# Patient Record
Sex: Female | Born: 1945
Health system: Southern US, Community
[De-identification: ages and names within clinical notes are randomized; demographics above are authoritative.]

## PROBLEM LIST (undated history)

## (undated) DIAGNOSIS — J45909 Unspecified asthma, uncomplicated: Secondary | ICD-10-CM

## (undated) DIAGNOSIS — H269 Unspecified cataract: Secondary | ICD-10-CM

## (undated) DIAGNOSIS — I10 Essential (primary) hypertension: Secondary | ICD-10-CM

## (undated) HISTORY — DX: Unspecified asthma, uncomplicated: J45.909

## (undated) HISTORY — DX: Essential (primary) hypertension: I10

## (undated) HISTORY — DX: Unspecified cataract: H26.9

---

## 2011-08-17 HISTORY — PX: CHOLECYSTECTOMY: SHX55

## 2013-03-13 ENCOUNTER — Encounter: Payer: Self-pay | Admitting: Family Medicine

## 2013-03-13 ENCOUNTER — Telehealth: Payer: Self-pay | Admitting: Family Medicine

## 2013-03-13 ENCOUNTER — Other Ambulatory Visit: Payer: Self-pay | Admitting: Family Medicine

## 2013-03-13 ENCOUNTER — Ambulatory Visit: Payer: Self-pay | Admitting: Family Medicine

## 2013-03-13 VITALS — BP 141/78 | HR 67 | Temp 98.2°F | Ht <= 58 in | Wt 104.0 lb

## 2013-03-13 DIAGNOSIS — H8110 Benign paroxysmal vertigo, unspecified ear: Secondary | ICD-10-CM | POA: Insufficient documentation

## 2013-03-13 MED ORDER — MECLIZINE HCL 25 MG PO TABS: 25.0000 mg | ORAL_TABLET | Freq: Three times a day (TID) | ORAL | Status: DC | PRN

## 2013-03-13 NOTE — Patient Instructions (Signed)
Vertigo Vertigo means you feel like you or your surroundings are moving when they are not. Vertigo can be dangerous if it occurs when you are at work, driving, or performing difficult activities.  CAUSES  Vertigo occurs when there is a conflict of signals sent to your brain from the visual and sensory systems in your body. There are many different causes of vertigo, including:  Infections, especially in the inner ear.  A bad reaction to a drug or misuse of alcohol and medicines.  Withdrawal from drugs or alcohol.  Rapidly changing positions, such as lying down or rolling over in bed.  A migraine headache.  Decreased blood flow to the brain.  Increased pressure in the brain from a head injury, infection, tumor, or bleeding. SYMPTOMS  You may feel as though the world is spinning around or you are falling to the ground. Because your balance is upset, vertigo can cause nausea and vomiting. You may have involuntary eye movements (nystagmus). DIAGNOSIS  Vertigo is usually diagnosed by physical exam. If the cause of your vertigo is unknown, your caregiver may perform imaging tests, such as an MRI scan (magnetic resonance imaging). TREATMENT  Most cases of vertigo resolve on their own, without treatment. Depending on the cause, your caregiver may prescribe certain medicines. If your vertigo is related to body position issues, your caregiver may recommend movements or procedures to correct the problem. In rare cases, if your vertigo is caused by certain inner ear problems, you may need surgery. HOME CARE INSTRUCTIONS   Follow your caregiver's instructions.  Avoid driving.  Avoid operating heavy machinery.  Avoid performing any tasks that would be dangerous to you or others during a vertigo episode.  Tell your caregiver if you notice that certain medicines seem to be causing your vertigo. Some of the medicines used to treat vertigo episodes can actually make them worse in some people. SEEK  IMMEDIATE MEDICAL CARE IF:   Your medicines do not relieve your vertigo or are making it worse.  You develop problems with talking, walking, weakness, or using your arms, hands, or legs.  You develop severe headaches.  Your nausea or vomiting continues or gets worse.  You develop visual changes.  A family member notices behavioral changes.  Your condition gets worse. MAKE SURE YOU:  Understand these instructions.  Will watch your condition.  Will get help right away if you are not doing well or get worse. Document Released: 05/12/2005 Document Revised: 10/25/2011 Document Reviewed: 02/18/2011 Exit[employer] Patient Information 2014 [employer].  

## 2013-03-13 NOTE — Progress Notes (Signed)
Patient ID: Rebecca Macdonald, female   DOB: [birth year], 67 y.o.   MRN: [id number] SUBJECTIVE: CC: Chief Complaint  Patient presents with  . Acute Visit    yesterday when getes up in am  "room spins but get ok thru out day gets better  . Medication Problem    wants to know if should continue vitamin d .   . Fever    states had fever for 2 days     HPI: Woke up and felt left ear feels like it was popping and then was dizzy with the room spinning. No headache, had a little temperature which went away.  ROS: As above in the HPI. All other systems are stable or negative.  OBJECTIVE: APPEARANCE:  Patient in no acute distress.The patient appeared well nourished and normally developed. Acyanotic. Waist: VITAL SIGNS:BP 141/78  Pulse 67  Temp(Src) 98.2 F (36.8 C) (Oral)  Ht 4\' 10"  (1.473 m)  Wt 104 lb (47.174 kg)  BMI 21.74 kg/m2   SKIN: warm and  Dry without overt rashes, tattoos and scars  HEAD and Neck: without JVD, Head and scalp: normal Eyes:No scleral icterus. Fundi normal, eye movements normal. Ears: Auricle normal, canal normal, Tympanic membranes normal, insufflation normal. Nose: normal Throat: many missing teeth Neck & thyroid: normal  CHEST & LUNGS: Chest wall: normal Lungs: Clear  CVS: Reveals the PMI to be normally located. Regular rhythm, First and Second Heart sounds are normal,  absence of murmurs, rubs or gallops. Peripheral vasculature: Radial pulses: normal Dorsal pedis pulses: normal Posterior pulses: normal  ABDOMEN:  Appearance: normal Benign, no organomegaly, no masses, no Abdominal Aortic enlargement. No Guarding , no rebound. No Bruits. Bowel sounds: normal  RECTAL: N/A GU: N/A  EXTREMETIES: nonedematous.  MUSCULOSKELETAL:  Spine: normal Joints: intact  NEUROLOGIC: oriented to time,place and person; nonfocal. Strength is normal Sensory is normal Reflexes are normal Cranial Nerves are normal. dix-halpike test mildly positive with  right lateral gaze.  ASSESSMENT: Benign paroxysmal positional vertigo - Plan: meclizine (ANTIVERT) 25 MG tablet  PLAN: Meds ordered this encounter  Medications  . omeprazole (PRILOSEC OTC) 20 MG tablet    Sig: Take 20 mg by mouth daily.  . meclizine (ANTIVERT) 25 MG tablet    Sig: Take 1 tablet (25 mg total) by mouth 3 (three) times daily as needed.    Dispense:  30 tablet    Refill:  0   Handout on Vertigo in the AVS..  Return in about 14 weeks (around 06/19/2013) for welcome to medicare physical..  Thelma Barge P. [place] Charon, M.D.

## 2013-03-13 NOTE — Telephone Encounter (Signed)
appt made

## 2013-06-19 ENCOUNTER — Ambulatory Visit: Payer: Self-pay | Admitting: Family Medicine

## 2013-06-21 ENCOUNTER — Encounter (INDEPENDENT_AMBULATORY_CARE_PROVIDER_SITE_OTHER): Payer: Self-pay

## 2013-06-21 ENCOUNTER — Ambulatory Visit: Payer: Medicare Other | Admitting: *Deleted

## 2013-06-21 ENCOUNTER — Other Ambulatory Visit (INDEPENDENT_AMBULATORY_CARE_PROVIDER_SITE_OTHER): Payer: Medicare Other | Admitting: *Deleted

## 2013-06-21 DIAGNOSIS — Z23 Encounter for immunization: Secondary | ICD-10-CM

## 2015-11-27 DIAGNOSIS — H5200 Hypermetropia, unspecified eye: Secondary | ICD-10-CM | POA: Diagnosis not present

## 2015-11-27 DIAGNOSIS — Z0100 Encounter for examination of eyes and vision without abnormal findings: Secondary | ICD-10-CM | POA: Diagnosis not present

## 2015-11-27 DIAGNOSIS — H25013 Cortical age-related cataract, bilateral: Secondary | ICD-10-CM | POA: Diagnosis not present

## 2016-04-16 ENCOUNTER — Ambulatory Visit (INDEPENDENT_AMBULATORY_CARE_PROVIDER_SITE_OTHER): Payer: Medicare HMO | Admitting: Pediatrics

## 2016-04-16 ENCOUNTER — Encounter: Payer: Self-pay | Admitting: Pediatrics

## 2016-04-16 VITALS — BP 136/74 | HR 62 | Temp 98.0°F | Ht <= 58 in | Wt 113.4 lb

## 2016-04-16 DIAGNOSIS — H8110 Benign paroxysmal vertigo, unspecified ear: Secondary | ICD-10-CM | POA: Diagnosis not present

## 2016-04-16 DIAGNOSIS — Z87891 Personal history of nicotine dependence: Secondary | ICD-10-CM

## 2016-04-16 DIAGNOSIS — R195 Other fecal abnormalities: Secondary | ICD-10-CM

## 2016-04-16 DIAGNOSIS — Z1239 Encounter for other screening for malignant neoplasm of breast: Secondary | ICD-10-CM | POA: Diagnosis not present

## 2016-04-16 DIAGNOSIS — Z1159 Encounter for screening for other viral diseases: Secondary | ICD-10-CM

## 2016-04-16 DIAGNOSIS — Z720 Tobacco use: Secondary | ICD-10-CM | POA: Diagnosis not present

## 2016-04-16 DIAGNOSIS — Z1211 Encounter for screening for malignant neoplasm of colon: Secondary | ICD-10-CM

## 2016-04-16 DIAGNOSIS — Z23 Encounter for immunization: Secondary | ICD-10-CM

## 2016-04-16 NOTE — Progress Notes (Signed)
  Subjective:   Patient ID: Rebecca Macdonald, female    DOB: [birth year], 70 y.o.   MRN: [id number] CC: New Patient (Initial Visit) follow up multiple med problems  HPI: Rebecca Macdonald is a 70 y.o. female presenting for New Patient (Initial Visit)  Overall feeling well Uses bathroom a lot after taking zantac Stopped all of her stomach medicines a week ago, feeling better, more normal stooling, but sometimes goes a day without stool H/o hemorrhoids several years ago, better now  Gallbladder out in 2015, since then having pain and loose stools with greasy meals Feels fine otherwise  No weight loss Eating regular meals Avoiding foods that make symptoms worse  Never had colonoscopy Pap smear: has been a long time since had pap  No chest pain Headaches and swimmy headed in the morning sometimes Has h/o BPPV, meclizine helps when she has a flare Hurts in forehead, takes tylenol, happens every once in a while, not weekly  Past Medical History:  Diagnosis Date  . Asthma   . Cataract   . Hypertension    Fam hx: No colon ca, no breast ca  History  Smoking Status  . Former Smoker  . Quit date: 03/13/2009  Smokeless Tobacco  . Never Used   ROS: All systems negative other than what is in the HPI  Objective:    BP 136/74   Pulse 62   Temp 98 F (36.7 C) (Oral)   Ht _0  (1.473 m)   Wt 113 lb 6.4 oz (51.4 kg)   BMI 23.70 kg/m   Wt Readings from Last 3 Encounters:  04/16/16 113 lb 6.4 oz (51.4 kg)  03/13/13 104 lb (47.2 kg)    Gen: NAD, alert, cooperative with exam, NCAT EYES: EOMI, no conjunctival injection, or no icterus ENT:  TMs pearly gray b/l, OP without erythema LYMPH: no cervical LAD CV: NRRR, normal S1/S2, no murmur, distal pulses 2+ b/l Resp: CTABL, no wheezes, normal WOB Abd: +BS, soft, NT, mildly distended. no guarding or organomegaly Ext: No edema, warm Neuro: Alert and oriented, strength equal b/l UE and LE, coordination grossly normal MSK: normal muscle  bulk  Assessment & Plan:  Sua was seen today for new patient (initial visit), follow up multiple problems.  Diagnoses and all orders for this visit:  Screen for colon cancer -     Ambulatory referral to Gastroenterology  Breast cancer screening -     MM DIGITAL SCREENING BILATERAL; Future  Benign paroxysmal positional vertigo, unspecified laterality Symptoms come and go Meclizine helps control symptoms  Loose stools Likely related to diet, pt s/p cholecystectomy -     CMP14+EGFR  Smoking history Quit several years ago -     Lipid panel  Need for hepatitis C screening test -     Hepatitis C antibody  Need for pneumococcal vaccination -     Pneumococcal polysaccharide vaccine 23-valent greater than or equal to 2yo subcutaneous/IM   Follow up plan: Return in about 2 months (around 06/16/2016) for pap smear. Assunta Found, MD [facility]

## 2016-04-16 NOTE — Patient Instructions (Signed)
Can take docusate stool softener twice a day as needed for constipation

## 2016-04-17 LAB — CMP14+EGFR
A/G RATIO: 1.7 (ref 1.2–2.2)
ALK PHOS: 81 IU/L (ref 39–117)
ALT: 18 IU/L (ref 0–32)
AST: 22 IU/L (ref 0–40)
Albumin: 4.3 g/dL (ref 3.6–4.8)
BILIRUBIN TOTAL: 0.3 mg/dL (ref 0.0–1.2)
BUN/Creatinine Ratio: 23 (ref 12–28)
BUN: 16 mg/dL (ref 8–27)
CHLORIDE: 100 mmol/L (ref 96–106)
CO2: 29 mmol/L (ref 18–29)
Calcium: 9.3 mg/dL (ref 8.7–10.3)
Creatinine, Ser: 0.7 mg/dL (ref 0.57–1.00)
GFR calc Af Amer: 104 mL/min/{1.73_m2} (ref >59)
GFR calc non Af Amer: 90 mL/min/{1.73_m2} (ref >59)
Globulin, Total: 2.6 g/dL (ref 1.5–4.5)
Glucose: 106 mg/dL — ABNORMAL HIGH (ref 65–99)
POTASSIUM: 4 mmol/L (ref 3.5–5.2)
SODIUM: 142 mmol/L (ref 134–144)
Total Protein: 6.9 g/dL (ref 6.0–8.5)

## 2016-04-17 LAB — LIPID PANEL
Chol/HDL Ratio: 4.4 ratio units (ref 0.0–4.4)
Cholesterol, Total: 180 mg/dL (ref 100–199)
HDL: 41 mg/dL (ref >39)
LDL Calculated: 114 mg/dL — ABNORMAL HIGH (ref 0–99)
TRIGLYCERIDES: 123 mg/dL (ref 0–149)
VLDL Cholesterol Cal: 25 mg/dL (ref 5–40)

## 2016-04-17 LAB — HEPATITIS C ANTIBODY: Hep C Virus Ab: <0.1 s/co ratio (ref 0.0–0.9)

## 2016-04-23 ENCOUNTER — Telehealth: Payer: Self-pay | Admitting: Pediatrics

## 2016-04-23 NOTE — Telephone Encounter (Signed)
Please review labs. 

## 2016-04-26 NOTE — Telephone Encounter (Signed)
Labs reviewed, tried to call pt to discuss, left message. See lab note.

## 2016-04-29 ENCOUNTER — Other Ambulatory Visit: Payer: Self-pay | Admitting: *Deleted

## 2016-04-29 MED ORDER — PRAVASTATIN SODIUM 40 MG PO TABS: 40.0000 mg | ORAL_TABLET | Freq: Every day | ORAL | 3 refills | Status: DC

## 2016-04-29 NOTE — Telephone Encounter (Signed)
Patient aware of lab results and medication sent to pharmacy

## 2016-05-26 ENCOUNTER — Ambulatory Visit (INDEPENDENT_AMBULATORY_CARE_PROVIDER_SITE_OTHER): Payer: Medicare HMO | Admitting: Gastroenterology

## 2016-05-26 ENCOUNTER — Encounter: Payer: Self-pay | Admitting: Gastroenterology

## 2016-05-26 ENCOUNTER — Other Ambulatory Visit: Payer: Self-pay

## 2016-05-26 DIAGNOSIS — Z8371 Family history of colonic polyps: Secondary | ICD-10-CM | POA: Insufficient documentation

## 2016-05-26 DIAGNOSIS — Z83719 Family history of colon polyps, unspecified: Secondary | ICD-10-CM

## 2016-05-26 MED ORDER — NA SULFATE-K SULFATE-MG SULF 17.5-3.13-1.6 GM/177ML PO SOLN: 1.0000 | ORAL | 0 refills | Status: DC

## 2016-05-26 NOTE — Progress Notes (Signed)
Primary Care Physician:  Carol L Vincent, MD  Primary Gastroenterologist:  Michael Rourk, MD   Chief Complaint  Patient presents with  . Colonoscopy    mucous in stool better since stopping antacid    HPI:  [name] is a 70 y.o. female here to schedule first ever colonoscopy. Patient has a family history of precancerous colon polyps in multiple siblings. At least 3 siblings polyps. No family history of colon cancer. Patient states she recently has had some mucus in her stools. This has seemed to resolve after stopping antacids and transitioning from Pepsi to water consumption. She reports her bowel movements are daily. She denies constipation. No melena or rectal bleeding. Denies abdominal pain, rectal pain. Appetite is good. Rare heartburn. No dysphagia. No unintentional weight loss.    Current Outpatient Prescriptions  Medication Sig Dispense Refill  . dimenhyDRINATE (DRAMAMINE) 50 MG tablet Take 50 mg by mouth every 8 (eight) hours as needed.    . pravastatin (PRAVACHOL) 40 MG tablet Take 1 tablet (40 mg total) by mouth daily. 90 tablet 3   No current facility-administered medications for this visit.     Allergies as of 05/26/2016  . (No Known Allergies)    Past Medical History:  Diagnosis Date  . Asthma   . Cataract   . Hypertension     Past Surgical History:  Procedure Laterality Date  . CHOLECYSTECTOMY  2013    Family History  Problem Relation Age of Onset  . Colon polyps Brother     high grade required partial colectomy but not cancer.   . Colon cancer Neg Hx     Social History   Social History  . Marital status: Widowed    Spouse name: N/A  . Number of children: N/A  . Years of education: N/A   Occupational History  . Not on file.   Social History Main Topics  . Smoking status: Former Smoker    Quit date: 03/13/2009  . Smokeless tobacco: Never Used  . Alcohol use No  . Drug use: No  . Sexual activity: Yes   Other Topics Concern  . Not on file    Social History Narrative  . No narrative on file      ROS:  General: Negative for anorexia, weight loss, fever, chills, fatigue, weakness. Eyes: Negative for vision changes.  ENT: Negative for hoarseness, difficulty swallowing , nasal congestion. CV: Negative for chest pain, angina, palpitations, dyspnea on exertion, peripheral edema.  Respiratory: Negative for dyspnea at rest, dyspnea on exertion, cough, sputum, wheezing.  GI: See history of present illness. GU:  Negative for dysuria, hematuria, urinary incontinence, urinary frequency, nocturnal urination.  MS: Negative for joint pain, low back pain.  Derm: Negative for rash or itching.  Neuro: Negative for weakness, abnormal sensation, seizure, frequent headaches, memory loss, confusion.  Psych: Negative for anxiety, depression, suicidal ideation, hallucinations.  Endo: Negative for unusual weight change.  Heme: Negative for bruising or bleeding. Allergy: Negative for rash or hives.    Physical Examination:  BP 126/79   Pulse 78   Temp 98 F (36.7 C) (Oral)   Ht 4' 10" (1.473 m)   Wt 108 lb 12.8 oz (49.4 kg)   BMI 22.74 kg/m    General: Well-nourished, well-developed in no acute distress.  Head: Normocephalic, atraumatic.   Eyes: Conjunctiva pink, no icterus. Mouth: Oropharyngeal mucosa moist and pink , no lesions erythema or exudate. Neck: Supple without thyromegaly, masses, or lymphadenopathy.  Lungs: Clear to auscultation   bilaterally.  Heart: Regular rate and rhythm, no murmurs rubs or gallops.  Abdomen: Bowel sounds are normal, nontender, nondistended, no hepatosplenomegaly or masses, no abdominal bruits or    hernia , no rebound or guarding.   Rectal: deferred Extremities: No lower extremity edema. No clubbing or deformities.  Neuro: Alert and oriented x 4 , grossly normal neurologically.  Skin: Warm and dry, no rash or jaundice.   Psych: Alert and cooperative, normal mood and affect.  Labs: Lab Results   Component Value Date   CREATININE 0.70 04/16/2016   BUN 16 04/16/2016   NA 142 04/16/2016   K 4.0 04/16/2016   CL 100 04/16/2016   CO2 29 04/16/2016   Lab Results  Component Value Date   ALT 18 04/16/2016   AST 22 04/16/2016   ALKPHOS 81 04/16/2016   BILITOT 0.3 04/16/2016     Imaging Studies: No results found.  Impression/plan:  70 year old female with family history of adenomatous colon polyps in multiple siblings who presents for first ever colonoscopy. She is at increased risk of colon cancer and is overdue for first ever colonoscopy. Colonoscopy in the near future with Dr. Gala Romney.  I have discussed the risks, alternatives, benefits with regards to but not limited to the risk of reaction to medication, bleeding, infection, perforation and the patient is agreeable to proceed. Written consent to be obtained.

## 2016-05-26 NOTE — Patient Instructions (Signed)
1. Colonoscopy as scheduled. See separate instructions.  

## 2016-05-27 NOTE — Progress Notes (Signed)
cc'ed to pcp °

## 2016-06-15 ENCOUNTER — Encounter (HOSPITAL_COMMUNITY)
Admission: RE | Disposition: A | Discharge status: Home / Self Care | Payer: Self-pay | Source: Ambulatory Visit | Attending: Internal Medicine

## 2016-06-15 ENCOUNTER — Ambulatory Visit (HOSPITAL_COMMUNITY)
Admission: RE | Admit: 2016-06-15 | Discharge: 2016-06-15 | Disposition: A | Discharge status: Home / Self Care | Payer: Medicare HMO | Source: Ambulatory Visit | Attending: Internal Medicine | Admitting: Internal Medicine

## 2016-06-15 ENCOUNTER — Encounter (HOSPITAL_COMMUNITY): Payer: Self-pay

## 2016-06-15 DIAGNOSIS — Z87891 Personal history of nicotine dependence: Secondary | ICD-10-CM | POA: Diagnosis not present

## 2016-06-15 DIAGNOSIS — Z79899 Other long term (current) drug therapy: Secondary | ICD-10-CM | POA: Diagnosis not present

## 2016-06-15 DIAGNOSIS — Z1212 Encounter for screening for malignant neoplasm of rectum: Secondary | ICD-10-CM | POA: Diagnosis not present

## 2016-06-15 DIAGNOSIS — Z8371 Family history of colonic polyps: Secondary | ICD-10-CM

## 2016-06-15 DIAGNOSIS — I10 Essential (primary) hypertension: Secondary | ICD-10-CM | POA: Diagnosis not present

## 2016-06-15 DIAGNOSIS — D122 Benign neoplasm of ascending colon: Secondary | ICD-10-CM

## 2016-06-15 DIAGNOSIS — D125 Benign neoplasm of sigmoid colon: Secondary | ICD-10-CM

## 2016-06-15 DIAGNOSIS — K5730 Diverticulosis of large intestine without perforation or abscess without bleeding: Secondary | ICD-10-CM | POA: Insufficient documentation

## 2016-06-15 DIAGNOSIS — Z1211 Encounter for screening for malignant neoplasm of colon: Secondary | ICD-10-CM | POA: Diagnosis not present

## 2016-06-15 DIAGNOSIS — J45909 Unspecified asthma, uncomplicated: Secondary | ICD-10-CM | POA: Diagnosis not present

## 2016-06-15 HISTORY — PX: POLYPECTOMY: SHX5525

## 2016-06-15 HISTORY — PX: COLONOSCOPY: SHX5424

## 2016-06-15 SURGERY — COLONOSCOPY
Anesthesia: Moderate Sedation

## 2016-06-15 MED ORDER — MEPERIDINE HCL 100 MG/ML IJ SOLN
INTRAMUSCULAR | Status: DC | PRN
  Administered 2016-06-15: 25 mg via INTRAVENOUS
  Administered 2016-06-15: 50 mg via INTRAVENOUS

## 2016-06-15 MED ORDER — ONDANSETRON HCL 4 MG/2ML IJ SOLN
INTRAMUSCULAR | Status: AC
  Filled 2016-06-15: qty 2

## 2016-06-15 MED ORDER — SODIUM CHLORIDE 0.9 % IV SOLN
INTRAVENOUS | Status: DC
  Administered 2016-06-15: 12:00:00 via INTRAVENOUS

## 2016-06-15 MED ORDER — STERILE WATER FOR IRRIGATION IR SOLN
Status: DC | PRN
  Administered 2016-06-15: 12:00:00

## 2016-06-15 MED ORDER — MEPERIDINE HCL 100 MG/ML IJ SOLN
INTRAMUSCULAR | Status: AC
  Filled 2016-06-15: qty 2

## 2016-06-15 MED ORDER — MIDAZOLAM HCL 5 MG/5ML IJ SOLN
INTRAMUSCULAR | Status: DC | PRN
  Administered 2016-06-15: 1 mg via INTRAVENOUS
  Administered 2016-06-15: 2 mg via INTRAVENOUS

## 2016-06-15 MED ORDER — ONDANSETRON HCL 4 MG/2ML IJ SOLN
INTRAMUSCULAR | Status: DC | PRN
  Administered 2016-06-15: 4 mg via INTRAVENOUS

## 2016-06-15 MED ORDER — MIDAZOLAM HCL 5 MG/5ML IJ SOLN
INTRAMUSCULAR | Status: AC
  Filled 2016-06-15: qty 10

## 2016-06-15 NOTE — Discharge Instructions (Signed)
Colonoscopy Discharge Instructions  Read the instructions outlined below and refer to this sheet in the next few weeks. These discharge instructions provide you with general information on caring for yourself after you leave the hospital. Your doctor may also give you specific instructions. While your treatment has been planned according to the most current medical practices available, unavoidable complications occasionally occur. If you have any problems or questions after discharge, call Dr. Gala Romney at [phone]. ACTIVITY  You may resume your regular activity, but move at a slower pace for the next 24 hours.   Take frequent rest periods for the next 24 hours.   Walking will help get rid of the air and reduce the bloated feeling in your belly (abdomen).   No driving for 24 hours (because of the medicine (anesthesia) used during the test).    Do not sign any important legal documents or operate any machinery for 24 hours (because of the anesthesia used during the test).  NUTRITION  Drink plenty of fluids.   You may resume your normal diet as instructed by your doctor.   Begin with a light meal and progress to your normal diet. Heavy or fried foods are harder to digest and may make you feel sick to your stomach (nauseated).   Avoid alcoholic beverages for 24 hours or as instructed.  MEDICATIONS  You may resume your normal medications unless your doctor tells you otherwise.  WHAT YOU CAN EXPECT TODAY  Some feelings of bloating in the abdomen.   Passage of more gas than usual.   Spotting of blood in your stool or on the toilet paper.  IF YOU HAD POLYPS REMOVED DURING THE COLONOSCOPY:  No aspirin products for 7 days or as instructed.   No alcohol for 7 days or as instructed.   Eat a soft diet for the next 24 hours.  FINDING OUT THE RESULTS OF YOUR TEST Not all test results are available during your visit. If your test results are not back during the visit, make an appointment  with your caregiver to find out the results. Do not assume everything is normal if you have not heard from your caregiver or the medical facility. It is important for you to follow up on all of your test results.  SEEK IMMEDIATE MEDICAL ATTENTION IF:  You have more than a spotting of blood in your stool.   Your belly is swollen (abdominal distention).   You are nauseated or vomiting.   You have a temperature over 101.   You have abdominal pain or discomfort that is severe or gets worse throughout the day.    Colon polyp and diverticulosis information provided  Benefiber 1 tablespoon twice daily  Further recommendations to follow pending review of pathology report   Diverticulosis Diverticulosis is the condition that develops when small pouches (diverticula) form in the wall of your colon. Your colon, or large intestine, is where water is absorbed and stool is formed. The pouches form when the inside layer of your colon pushes through weak spots in the outer layers of your colon. CAUSES  No one knows exactly what causes diverticulosis. RISK FACTORS  Being older than 37. Your risk for this condition increases with age. Diverticulosis is rare in people younger than 40 years. By age 69, almost everyone has it.  Eating a low-fiber diet.  Being frequently constipated.  Being overweight.  Not getting enough exercise.  Smoking.  Taking over-the-counter pain medicines, like aspirin and ibuprofen. SYMPTOMS  Most people with  diverticulosis do not have symptoms. DIAGNOSIS  Because diverticulosis often has no symptoms, health care providers often discover the condition during an exam for other colon problems. In many cases, a health care provider will diagnose diverticulosis while using a flexible scope to examine the colon (colonoscopy). TREATMENT  If you have never developed an infection related to diverticulosis, you may not need treatment. If you have had an infection before,  treatment may include:  Eating more fruits, vegetables, and grains.  Taking a fiber supplement.  Taking a live bacteria supplement (probiotic).  Taking medicine to relax your colon. HOME CARE INSTRUCTIONS   Drink at least 6-8 glasses of water each day to prevent constipation.  Try not to strain when you have a bowel movement.  Keep all follow-up appointments. If you have had an infection before:  Increase the fiber in your diet as directed by your health care provider or dietitian.  Take a dietary fiber supplement if your health care provider approves.  Only take medicines as directed by your health care provider. SEEK MEDICAL CARE IF:   You have abdominal pain.  You have bloating.  You have cramps.  You have not gone to the bathroom in 3 days. SEEK IMMEDIATE MEDICAL CARE IF:   Your pain gets worse.  Yourbloating becomes very bad.  You have a fever or chills, and your symptoms suddenly get worse.  You begin vomiting.  You have bowel movements that are bloody or black. MAKE SURE YOU:  Understand these instructions.  Will watch your condition.  Will get help right away if you are not doing well or get worse.   This information is not intended to replace advice given to you by your health care provider. Make sure you discuss any questions you have with your health care provider.   Document Released: 04/29/2004 Document Revised: 08/07/2013 Document Reviewed: 06/27/2013 Elsevier Interactive Patient Education 2016 Else[employer] Inc[employer]   Colon Polyps Polyps are lumps of extra tissue growing inside the body. Polyps can grow in the large intestine (colon). Most colon polyps are noncancerous (benign). However, some colon polyps can become cancerous over time. Polyps that are larger than a pea may be harmful. To be safe, caregivers remove and test all polyps. CAUSES  Polyps form when mutations in the genes cause your cells to grow and divide even though no more tissue is  needed. RISK FACTORS There are a number of risk factors that can increase your chances of getting colon polyps. They include:  Being older than 50 years.  Family history of colon polyps or colon cancer.  Long-term colon diseases, such as colitis or Crohn disease.  Being overweight.  Smoking.  Being inactive.  Drinking too much alcohol. SYMPTOMS  Most small polyps do not cause symptoms. If symptoms are present, they may include:  Blood in the stool. The stool may look dark red or black.  Constipation or diarrhea that lasts longer than 1 week. DIAGNOSIS People often do not know they have polyps until their caregiver finds them during a regular checkup. Your caregiver can use 4 tests to check for polyps:  Digital rectal exam. The caregiver wears gloves and feels inside the rectum. This test would find polyps only in the rectum.  Barium enema. The caregiver puts a liquid called barium into your rectum before taking X-rays of your colon. Barium makes your colon look white. Polyps are dark, so they are easy to see in the X-ray pictures.  Sigmoidoscopy. A thin, flexible tube (sigmoidoscope)  is placed into your rectum. The sigmoidoscope has a light and tiny camera in it. The caregiver uses the sigmoidoscope to look at the last third of your colon.  Colonoscopy. This test is like sigmoidoscopy, but the caregiver looks at the entire colon. This is the most common method for finding and removing polyps. TREATMENT  Any polyps will be removed during a sigmoidoscopy or colonoscopy. The polyps are then tested for cancer. PREVENTION  To help lower your risk of getting more colon polyps:  Eat plenty of fruits and vegetables. Avoid eating fatty foods.  Do not smoke.  Avoid drinking alcohol.  Exercise every day.  Lose weight if recommended by your caregiver.  Eat plenty of calcium and folate. Foods that are rich in calcium include milk, cheese, and broccoli. Foods that are rich in folate  include chickpeas, kidney beans, and spinach. HOME CARE INSTRUCTIONS Keep all follow-up appointments as directed by your caregiver. You may need periodic exams to check for polyps. SEEK MEDICAL CARE IF: You notice bleeding during a bowel movement.   This information is not intended to replace advice given to you by your health care provider. Make sure you discuss any questions you have with your health care provider.   Document Released: 04/28/2004 Document Revised: 08/23/2014 Document Reviewed: 10/12/2011 Elsevier Interactive Patient Education [employer].

## 2016-06-15 NOTE — Interval H&P Note (Signed)
History and Physical Interval Note:  06/15/2016 11:54 AM  Rebecca Macdonald  has presented today for surgery, with the diagnosis of FAMILY HISTORY OF COLON POLYPS  The various methods of treatment have been discussed with the patient and family. After consideration of risks, benefits and other options for treatment, the patient has consented to  Procedure(s) with comments: COLONOSCOPY (N/A) - 1215 as a surgical intervention .  The patient's history has been reviewed, patient examined, no change in status, stable for surgery.  I have reviewed the patient's chart and labs.  Questions were answered to the patient's satisfaction.     No change. Screening colonoscopy per plan.  The risks, benefits, limitations, alternatives and imponderables have been reviewed with the patient. Questions have been answered. All parties are agreeable.   Manus Rudd

## 2016-06-15 NOTE — H&P (View-Only) (Signed)
Primary Care Physician:  Eustaquio Maize, MD  Primary Gastroenterologist:  Garfield Cornea, MD   Chief Complaint  Patient presents with  . Colonoscopy    mucous in stool better since stopping antacid    HPI:  Rebecca Macdonald is a 70 y.o. female here to schedule first ever colonoscopy. Patient has a family history of precancerous colon polyps in multiple siblings. At least 3 siblings polyps. No family history of colon cancer. Patient states she recently has had some mucus in her stools. This has seemed to resolve after stopping antacids and transitioning from Pepsi to water consumption. She reports her bowel movements are daily. She denies constipation. No melena or rectal bleeding. Denies abdominal pain, rectal pain. Appetite is good. Rare heartburn. No dysphagia. No unintentional weight loss.    Current Outpatient Prescriptions  Medication Sig Dispense Refill  . dimenhyDRINATE (DRAMAMINE) 50 MG tablet Take 50 mg by mouth every 8 (eight) hours as needed.    . pravastatin (PRAVACHOL) 40 MG tablet Take 1 tablet (40 mg total) by mouth daily. 90 tablet 3   No current facility-administered medications for this visit.     Allergies as of 05/26/2016  . (No Known Allergies)    Past Medical History:  Diagnosis Date  . Asthma   . Cataract   . Hypertension     Past Surgical History:  Procedure Laterality Date  . CHOLECYSTECTOMY  2013    Family History  Problem Relation Age of Onset  . Colon polyps Brother     high grade required partial colectomy but not cancer.   . Colon cancer Neg Hx     Social History   Social History  . Marital status: Widowed    Spouse name: N/A  . Number of children: N/A  . Years of education: N/A   Occupational History  . Not on file.   Social History Main Topics  . Smoking status: Former Smoker    Quit date: 03/13/2009  . Smokeless tobacco: Never Used  . Alcohol use No  . Drug use: No  . Sexual activity: Yes   Other Topics Concern  . Not on file    Social History Narrative  . No narrative on file      ROS:  General: Negative for anorexia, weight loss, fever, chills, fatigue, weakness. Eyes: Negative for vision changes.  ENT: Negative for hoarseness, difficulty swallowing , nasal congestion. CV: Negative for chest pain, angina, palpitations, dyspnea on exertion, peripheral edema.  Respiratory: Negative for dyspnea at rest, dyspnea on exertion, cough, sputum, wheezing.  GI: See history of present illness. GU:  Negative for dysuria, hematuria, urinary incontinence, urinary frequency, nocturnal urination.  MS: Negative for joint pain, low back pain.  Derm: Negative for rash or itching.  Neuro: Negative for weakness, abnormal sensation, seizure, frequent headaches, memory loss, confusion.  Psych: Negative for anxiety, depression, suicidal ideation, hallucinations.  Endo: Negative for unusual weight change.  Heme: Negative for bruising or bleeding. Allergy: Negative for rash or hives.    Physical Examination:  BP 126/79   Pulse 78   Temp 98 F (36.7 C) (Oral)   Ht 4\' 10"  (1.473 m)   Wt 108 lb 12.8 oz (49.4 kg)   BMI 22.74 kg/m    General: Well-nourished, well-developed in no acute distress.  Head: Normocephalic, atraumatic.   Eyes: Conjunctiva pink, no icterus. Mouth: Oropharyngeal mucosa moist and pink , no lesions erythema or exudate. Neck: Supple without thyromegaly, masses, or lymphadenopathy.  Lungs: Clear to auscultation  bilaterally.  Heart: Regular rate and rhythm, no murmurs rubs or gallops.  Abdomen: Bowel sounds are normal, nontender, nondistended, no hepatosplenomegaly or masses, no abdominal bruits or    hernia , no rebound or guarding.   Rectal: deferred Extremities: No lower extremity edema. No clubbing or deformities.  Neuro: Alert and oriented x 4 , grossly normal neurologically.  Skin: Warm and dry, no rash or jaundice.   Psych: Alert and cooperative, normal mood and affect.  Labs: Lab Results   Component Value Date   CREATININE 0.70 04/16/2016   BUN 16 04/16/2016   NA 142 04/16/2016   K 4.0 04/16/2016   CL 100 04/16/2016   CO2 29 04/16/2016   Lab Results  Component Value Date   ALT 18 04/16/2016   AST 22 04/16/2016   ALKPHOS 81 04/16/2016   BILITOT 0.3 04/16/2016     Imaging Studies: No results found.  Impression/plan:  70 year old female with family history of adenomatous colon polyps in multiple siblings who presents for first ever colonoscopy. She is at increased risk of colon cancer and is overdue for first ever colonoscopy. Colonoscopy in the near future with Dr. Gala Romney.  I have discussed the risks, alternatives, benefits with regards to but not limited to the risk of reaction to medication, bleeding, infection, perforation and the patient is agreeable to proceed. Written consent to be obtained.

## 2016-06-15 NOTE — Op Note (Signed)
[facility] Patient Name: Sophronia Herrada Procedure Date: 06/15/2016 11:27 AM MRN: [id number] Date of Birth: March 06, 1946 Attending MD: Norvel Richards , MD CSN: [id number] Age: 70 Admit Type: Outpatient Procedure:                Colonoscopy Indications:              Screening for colorectal malignant neoplasm Providers:                Norvel Richards, MD, Charlyne Petrin RN, RN,                            Lurline Del, RN Referring MD:              Medicines:                Midazolam 3 mg IV, Meperidine 75 mg IV Complications:            No immediate complications. Estimated Blood Loss:     Estimated blood loss was minimal. Procedure:                Pre-Anesthesia Assessment:                           - Prior to the procedure, a History and Physical                            was performed, and patient medications and                            allergies were reviewed. The patient's tolerance of                            previous anesthesia was also reviewed. The risks                            and benefits of the procedure and the sedation                            options and risks were discussed with the patient.                            All questions were answered, and informed consent                            was obtained. Prior Anticoagulants: The patient has                            taken no previous anticoagulant or antiplatelet                            agents. ASA Grade Assessment: II - A patient with                            mild systemic disease. After reviewing the risks  and benefits, the patient was deemed in                            satisfactory condition to undergo the procedure.                           After obtaining informed consent, the colonoscope                            was passed under direct vision. Throughout the                            procedure, the patient's blood pressure, pulse, and               oxygen saturations were monitored continuously. The                            EC-[id number] scope was introduced through                            the anus and advanced to the the cecum, identified                            by appendiceal orifice and ileocecal valve. The                            colonoscopy was performed without difficulty. The                            patient tolerated the procedure well. The quality                            of the bowel preparation was adequate. The entire                            colon was well visualized. The ileocecal valve,                            appendiceal orifice, and rectum were photographed. Scope In: 12:04:37 PM Scope Out: 12:18:10 PM Scope Withdrawal Time: 0 hours 10 minutes 24 seconds  Total Procedure Duration: 0 hours 13 minutes 33 seconds  Findings:      The perianal and digital rectal examinations were normal.      Scattered diverticula were found in the sigmoid colon and descending       colon.      Two semi-pedunculated polyps were found in the ascending colon. The       polyps were 4 to 6 mm in size. These polyps were removed with a cold       snare. Resection and retrieval were complete. Estimated blood loss was       minimal.      A 7 mm polyp was found in the sigmoid colon. The polyp was pedunculated.       The polyp was removed with a hot snare. Resection and retrieval were       complete.  The exam was otherwise without abnormality on direct and retroflexion       views aside from internal ds and anal papilla.. Impression:               - Diverticulosis in the sigmoid colon and in the                            descending colon.                           - Two 4 to 6 mm polyps in the ascending colon,                            removed with a cold snare. Resected and retrieved.                           - One 7 mm polyp in the sigmoid colon, removed with                            a hot  snare. Resected and retrieved.                           - The examination was otherwise normal on direct                            and retroflexion views. Moderate Sedation:      Moderate (conscious) sedation was administered by the endoscopy nurse       and supervised by the endoscopist. The following parameters were       monitored: oxygen saturation, heart rate, blood pressure, and response       to care. Total physician intraservice time was 19 minutes. Recommendation:           - Written discharge instructions were provided to                            the patient.                           - The signs and symptoms of potential delayed                            complications were discussed with the patient.                           - Patient has a contact number available for                            emergencies. The signs and symptoms of potential                            delayed complications were discussed with the                            patient. Return to normal activities tomorrow.  Written discharge instructions were provided to the                            patient.                           - Resume previous diet.                           - Continue present medications.                           - Repeat colonoscopy date to be determined after                            pending pathology results are reviewed for                            surveillance based on pathology results.                           - Return to GI office (date not yet determined). Procedure Code(s):        --- Professional ---                           [phone], Colonoscopy, flexible; with removal of                            tumor(s), polyp(s), or other lesion(s) by snare                            technique                           99152, Moderate sedation services provided by the                            same physician or other qualified health care                             professional performing the diagnostic or                            therapeutic service that the sedation supports,                            requiring the presence of an independent trained                            observer to assist in the monitoring of the                            patient's level of consciousness and physiological                            status; initial 15  minutes of intraservice time,                            patient age 63 years or older Diagnosis Code(s):        --- Professional ---                           Z12.11, Encounter for screening for malignant                            neoplasm of colon                           D12.2, Benign neoplasm of ascending colon                           D12.5, Benign neoplasm of sigmoid colon                           K57.30, Diverticulosis of large intestine without                            perforation or abscess without bleeding CPT copyright 2016 American Medical Association. All rights reserved. The codes documented in this report are preliminary and upon coder review may  be revised to meet current compliance requirements. Cristopher Estimable. Sierah Lacewell, MD Norvel Richards, MD 06/15/2016 12:27:20 PM This report has been signed electronically. Number of Addenda: 0

## 2016-06-16 ENCOUNTER — Encounter (HOSPITAL_COMMUNITY): Payer: Self-pay | Admitting: Internal Medicine

## 2016-06-18 ENCOUNTER — Encounter: Payer: Self-pay | Admitting: Internal Medicine

## 2016-06-25 ENCOUNTER — Encounter: Payer: Self-pay | Admitting: Pediatrics

## 2016-06-25 ENCOUNTER — Ambulatory Visit (INDEPENDENT_AMBULATORY_CARE_PROVIDER_SITE_OTHER): Payer: Medicare HMO | Admitting: Pediatrics

## 2016-06-25 VITALS — BP 114/70 | HR 71 | Temp 97.5°F | Ht <= 58 in | Wt 111.2 lb

## 2016-06-25 DIAGNOSIS — Z23 Encounter for immunization: Secondary | ICD-10-CM

## 2016-06-25 DIAGNOSIS — Z0000 Encounter for general adult medical examination without abnormal findings: Secondary | ICD-10-CM | POA: Diagnosis not present

## 2016-06-25 DIAGNOSIS — H8110 Benign paroxysmal vertigo, unspecified ear: Secondary | ICD-10-CM

## 2016-06-25 DIAGNOSIS — E785 Hyperlipidemia, unspecified: Secondary | ICD-10-CM | POA: Insufficient documentation

## 2016-06-25 DIAGNOSIS — Z01419 Encounter for gynecological examination (general) (routine) without abnormal findings: Secondary | ICD-10-CM | POA: Diagnosis not present

## 2016-06-25 NOTE — Progress Notes (Signed)
  Subjective:   Patient ID: Rebecca Macdonald, female    DOB: [birth year], 70 y.o.   MRN: [id number] CC: Gynecologic Exam  HPI: Rebecca Macdonald is a 70 y.o. female presenting for Gynecologic Exam  Mammogram: scheduled for January Colonoscopy: needs repeat 06/2019  Pap smear today as has not had one in decades No history of abnormals No vaginal bleeding Post-menopausal  Relevant past medical, surgical, family and social history reviewed. Allergies and medications reviewed and updated. History  Smoking Status  . Former Smoker  . Quit date: 03/13/2009  Smokeless Tobacco  . Never Used   ROS: Per HPI   Objective:    BP 114/70   Pulse 71   Temp 97.5 F (36.4 C) (Oral)   Ht 4\' 10"  (1.473 m)   Wt 111 lb 3.2 oz (50.4 kg)   BMI 23.24 kg/m   Wt Readings from Last 3 Encounters:  06/25/16 111 lb 3.2 oz (50.4 kg)  05/26/16 108 lb 12.8 oz (49.4 kg)  04/16/16 113 lb 6.4 oz (51.4 kg)    Gen: NAD, alert, cooperative with exam, NCAT EYES: EOMI, no conjunctival injection, or no icterus CV: NRRR, normal S1/S2, no murmur, distal pulses 2+ b/l Resp: CTABL, no wheezes, normal WOB Abd: +BS, soft, NTND. no guarding or organomegaly Ext: No edema, warm Neuro: Alert and oriented MSK: normal muscle bulk Breast: normal b/l GU: normal ext female genitalia, normal appearing cervix, some vaginal atrophy present  Assessment & Plan:  Rebecca Macdonald was seen today for gynecologic exam.  Diagnoses and all orders for this visit:  Encounter for preventive health examination -     Pap IG and HPV (high risk) DNA detection mammo scheduled for Jan 2018 Colonoscopy repeat due 2020  Hyperlipidemia, unspecified hyperlipidemia type Cont pravastatin  Benign paroxysmal positional vertigo, unspecified laterality Meclizine has helped a lot with symptoms, cont prn  Follow up plan: Return in about 1 year (around 06/25/2017). Assunta Found, MD [place]

## 2016-07-01 LAB — PAP IG AND HPV HIGH-RISK: PAP SMEAR COMMENT: 0

## 2016-07-01 LAB — HPV, LOW VOLUME (REFLEX): HPV low volume reflex: NEGATIVE

## 2016-07-14 ENCOUNTER — Encounter: Payer: Self-pay | Admitting: *Deleted

## 2016-09-15 ENCOUNTER — Encounter: Payer: Medicare HMO | Admitting: *Deleted

## 2016-09-15 DIAGNOSIS — Z1231 Encounter for screening mammogram for malignant neoplasm of breast: Secondary | ICD-10-CM | POA: Diagnosis not present

## 2016-09-22 ENCOUNTER — Other Ambulatory Visit: Payer: Self-pay | Admitting: Pediatrics

## 2016-09-22 DIAGNOSIS — N6489 Other specified disorders of breast: Secondary | ICD-10-CM

## 2016-10-12 ENCOUNTER — Ambulatory Visit (HOSPITAL_COMMUNITY)
Admission: RE | Admit: 2016-10-12 | Discharge: 2016-10-12 | Disposition: A | Discharge status: Home / Self Care | Payer: Medicare HMO | Source: Ambulatory Visit | Attending: Pediatrics | Admitting: Pediatrics

## 2016-10-12 DIAGNOSIS — N6489 Other specified disorders of breast: Secondary | ICD-10-CM | POA: Diagnosis present

## 2016-10-12 DIAGNOSIS — N6312 Unspecified lump in the right breast, upper inner quadrant: Secondary | ICD-10-CM | POA: Insufficient documentation

## 2016-10-12 DIAGNOSIS — N6311 Unspecified lump in the right breast, upper outer quadrant: Secondary | ICD-10-CM | POA: Diagnosis not present

## 2016-10-12 DIAGNOSIS — R928 Other abnormal and inconclusive findings on diagnostic imaging of breast: Secondary | ICD-10-CM | POA: Diagnosis not present

## 2016-10-12 DIAGNOSIS — R922 Inconclusive mammogram: Secondary | ICD-10-CM | POA: Diagnosis not present

## 2016-11-25 ENCOUNTER — Ambulatory Visit (INDEPENDENT_AMBULATORY_CARE_PROVIDER_SITE_OTHER): Payer: Medicare HMO | Admitting: Family Medicine

## 2016-11-25 ENCOUNTER — Encounter: Payer: Self-pay | Admitting: Family Medicine

## 2016-11-25 VITALS — BP 126/73 | HR 92 | Temp 99.5°F | Ht <= 58 in | Wt 114.0 lb

## 2016-11-25 DIAGNOSIS — J0101 Acute recurrent maxillary sinusitis: Secondary | ICD-10-CM

## 2016-11-25 MED ORDER — AZITHROMYCIN 250 MG PO TABS: ORAL_TABLET | ORAL | 0 refills | Status: DC

## 2016-11-25 MED ORDER — FLUTICASONE PROPIONATE 50 MCG/ACT NA SUSP: 1.0000 | Freq: Two times a day (BID) | NASAL | 6 refills | Status: DC | PRN

## 2016-11-25 NOTE — Progress Notes (Signed)
BP 126/73   Pulse 92   Temp 99.5 F (37.5 C) (Oral)   Ht 4\' 10"  (1.473 m)   Wt 114 lb (51.7 kg)   BMI 23.83 kg/m    Subjective:    Patient ID: Rebecca Macdonald, female    DOB: [birth year], 71 y.o.   MRN: [id number]  HPI: Rebecca Macdonald is a 71 y.o. female presenting on 11/25/2016 for Sinusitis (sinus congestion, drainage, left ear pain; symptoms began 3 days ago)   HPI Sore Throat: Patient presents with a sore throat that began on Monday 3 days ago. Patient states that pain started after the power went out on Sunday night and is associated with cough, hoarseness, dysphagia and left ear pain. Patient has been taking Nyquil and cough drops with some relief. Patient states that sore throat became worse this morning and she feels like she has lost her voice. Patient states she feels like she has had fever and chills the past two days but has not taken her temperature. Cough has been productive with yellow sputum.  Relevant past medical, surgical, family and social history reviewed and updated as indicated. Interim medical history since our last visit reviewed. Allergies and medications reviewed and updated.  Review of Systems  Constitutional: Positive for chills and fever. Negative for diaphoresis and unexpected weight change.  HENT: Positive for congestion, ear pain, sinus pressure, sore throat and voice change. Negative for hearing loss.   Eyes: Negative for photophobia, pain, discharge and visual disturbance.  Respiratory: Positive for cough. Negative for apnea, choking, chest tightness, shortness of breath and wheezing.   Cardiovascular: Negative for chest pain and palpitations.  Gastrointestinal: Negative for abdominal pain, constipation, diarrhea, nausea and vomiting.  Genitourinary: Negative for dysuria and hematuria.  Musculoskeletal: Negative for arthralgias and myalgias.  Skin: Negative for rash.  Allergic/Immunologic: Positive for environmental allergies.  Neurological: Negative for  dizziness, syncope, weakness, numbness and headaches.    Per HPI unless specifically indicated above     Objective:    BP 126/73   Pulse 92   Temp 99.5 F (37.5 C) (Oral)   Ht 4\' 10"  (1.473 m)   Wt 114 lb (51.7 kg)   BMI 23.83 kg/m   Wt Readings from Last 3 Encounters:  11/25/16 114 lb (51.7 kg)  06/25/16 111 lb 3.2 oz (50.4 kg)  05/26/16 108 lb 12.8 oz (49.4 kg)    Physical Exam  Constitutional: She is oriented to person, place, and time. She appears well-developed and well-nourished. No distress.  HENT:  Head: Normocephalic and atraumatic.  Right Ear: External ear normal. Tympanic membrane is not perforated, not erythematous and not bulging.  Left Ear: External ear normal. Tympanic membrane is not perforated, not erythematous and not bulging.  Ears:  Mouth/Throat: Oropharynx is clear and moist.  Eyes: Conjunctivae and EOM are normal. Pupils are equal, round, and reactive to light.  Neck: Normal range of motion. Neck supple. No tracheal deviation present.  Cardiovascular: Normal rate, regular rhythm and intact distal pulses.  Exam reveals no gallop and no friction rub.   No murmur heard. Pulmonary/Chest: Effort normal. Stridor present. No respiratory distress. She has no wheezes. She has no rales. She exhibits tenderness.  Abdominal: Soft. She exhibits no distension. There is no tenderness.  Musculoskeletal: Normal range of motion.  Lymphadenopathy:    She has no cervical adenopathy.  Neurological: She is alert and oriented to person, place, and time.  Skin: Skin is warm and dry. She is not diaphoretic.  Psychiatric:  She has a normal mood and affect. Her behavior is normal.      Assessment & Plan:   Problem List Items Addressed This Visit    None    Visit Diagnoses    Acute recurrent maxillary sinusitis    -  Primary   Likely has a component of allergies, will send Flonase and azithromycin, recommended for her to pick up an allergy pill   Relevant Medications    fluticasone (FLONASE) 50 MCG/ACT nasal spray   azithromycin (ZITHROMAX) 250 MG tablet      Patient seen and examined with Nuala Alpha, PA student. Agree with assessment and plan above. Continue Z-Pak and Flonase and pick up and antihistamine.  Follow up plan: Return if symptoms worsen or fail to improve.  Counseling provided for all of the vaccine components No orders of the defined types were placed in this encounter.

## 2017-03-18 ENCOUNTER — Other Ambulatory Visit: Payer: Self-pay | Admitting: Pediatrics

## 2017-03-18 DIAGNOSIS — N63 Unspecified lump in unspecified breast: Secondary | ICD-10-CM

## 2017-04-12 ENCOUNTER — Ambulatory Visit (HOSPITAL_COMMUNITY)
Admission: RE | Admit: 2017-04-12 | Discharge: 2017-04-12 | Disposition: A | Discharge status: Home / Self Care | Payer: Medicare HMO | Source: Ambulatory Visit | Attending: Pediatrics | Admitting: Pediatrics

## 2017-04-12 DIAGNOSIS — N6312 Unspecified lump in the right breast, upper inner quadrant: Secondary | ICD-10-CM | POA: Insufficient documentation

## 2017-04-12 DIAGNOSIS — N63 Unspecified lump in unspecified breast: Secondary | ICD-10-CM

## 2017-04-12 DIAGNOSIS — N6310 Unspecified lump in the right breast, unspecified quadrant: Secondary | ICD-10-CM | POA: Diagnosis not present

## 2017-05-19 DIAGNOSIS — H25013 Cortical age-related cataract, bilateral: Secondary | ICD-10-CM | POA: Diagnosis not present

## 2017-05-19 DIAGNOSIS — H5200 Hypermetropia, unspecified eye: Secondary | ICD-10-CM | POA: Diagnosis not present

## 2017-07-01 ENCOUNTER — Encounter: Payer: Self-pay | Admitting: Pediatrics

## 2017-07-01 ENCOUNTER — Ambulatory Visit (INDEPENDENT_AMBULATORY_CARE_PROVIDER_SITE_OTHER): Payer: Medicare HMO | Admitting: Pediatrics

## 2017-07-01 VITALS — BP 129/67 | HR 74 | Temp 98.0°F | Ht <= 58 in | Wt 115.6 lb

## 2017-07-01 DIAGNOSIS — Z0000 Encounter for general adult medical examination without abnormal findings: Secondary | ICD-10-CM

## 2017-07-01 DIAGNOSIS — Z23 Encounter for immunization: Secondary | ICD-10-CM

## 2017-07-01 DIAGNOSIS — Z780 Asymptomatic menopausal state: Secondary | ICD-10-CM

## 2017-07-01 DIAGNOSIS — E785 Hyperlipidemia, unspecified: Secondary | ICD-10-CM

## 2017-07-01 MED ORDER — PRAVASTATIN SODIUM 40 MG PO TABS: 40.0000 mg | ORAL_TABLET | Freq: Every day | ORAL | 3 refills | Status: DC

## 2017-07-01 NOTE — Progress Notes (Signed)
  Subjective:   Patient ID: Rebecca Macdonald, female    DOB: 13-Nov-1945, 71 y.o.   MRN: [id number] CC: Annual Exam  HPI: Rebecca Macdonald is a 71 y.o. female presenting for Annual Exam  Has been feeling well Cutting back on sodas Eating some sweets regularly Planning on retiring next month Is excited about the change Wants to stay active, open to increasing walking  Mom broke both of her hips and her older age  26: Taking statin daily, no side effects  Next mammogram scheduled for early next year  Relevant past medical, surgical, family and social history reviewed. Allergies and medications reviewed and updated. Social History   Tobacco Use  Smoking Status Former Smoker  . Last attempt to quit: 03/13/2009  . Years since quitting: 8.3  Smokeless Tobacco Never Used   ROS: All systems negative other than what is in HPI  Objective:    BP 129/67   Pulse 74   Temp 98 F (36.7 C) (Oral)   Ht 4' 10" (1.473 m)   Wt 115 lb 9.6 oz (52.4 kg)   BMI 24.16 kg/m   Wt Readings from Last 3 Encounters:  07/01/17 115 lb 9.6 oz (52.4 kg)  11/25/16 114 lb (51.7 kg)  06/25/16 111 lb 3.2 oz (50.4 kg)    Gen: NAD, alert, cooperative with exam, NCAT EYES: EOMI, no conjunctival injection, or no icterus ENT:  TMs pearly gray b/l, OP without erythema LYMPH: no cervical LAD CV: NRRR, normal S1/S2, no murmur, distal pulses 2+ b/l Resp: CTABL, no wheezes, normal WOB Abd: +BS, soft, NTND. no guarding or organomegaly,  Ext: No edema, warm Neuro: Alert and oriented, strength equal b/l UE and LE, coordination grossly normal MSK: normal muscle bulk  Assessment & Plan:  Rebecca Macdonald was seen today for annual exam.  Diagnoses and all orders for this visit:  Encounter for preventive health examination -     BMP8+EGFR  Hyperlipidemia, unspecified hyperlipidemia type Stable, cont below -     pravastatin (PRAVACHOL) 40 MG tablet; Take 1 tablet (40 mg total) daily by mouth.  Post-menopausal -     DG WRFM  DEXA  Need for immunization against influenza -     Flu Vaccine QUAD 36+ mos IM   Follow up plan: Return in about 1 year (around 07/01/2018). Assunta Found, MD [place]

## 2017-07-04 ENCOUNTER — Other Ambulatory Visit: Payer: Medicare HMO

## 2018-05-22 DIAGNOSIS — H25013 Cortical age-related cataract, bilateral: Secondary | ICD-10-CM | POA: Diagnosis not present

## 2018-05-22 DIAGNOSIS — H5200 Hypermetropia, unspecified eye: Secondary | ICD-10-CM | POA: Diagnosis not present

## 2018-05-22 DIAGNOSIS — Z0100 Encounter for examination of eyes and vision without abnormal findings: Secondary | ICD-10-CM | POA: Diagnosis not present

## 2018-07-04 ENCOUNTER — Ambulatory Visit (INDEPENDENT_AMBULATORY_CARE_PROVIDER_SITE_OTHER): Payer: Medicare HMO | Admitting: Pediatrics

## 2018-07-04 ENCOUNTER — Ambulatory Visit (INDEPENDENT_AMBULATORY_CARE_PROVIDER_SITE_OTHER): Payer: Medicare HMO

## 2018-07-04 ENCOUNTER — Encounter: Payer: Self-pay | Admitting: Pediatrics

## 2018-07-04 VITALS — BP 136/81 | HR 87 | Temp 97.7°F | Ht <= 58 in | Wt 123.0 lb

## 2018-07-04 DIAGNOSIS — R928 Other abnormal and inconclusive findings on diagnostic imaging of breast: Secondary | ICD-10-CM

## 2018-07-04 DIAGNOSIS — Z780 Asymptomatic menopausal state: Secondary | ICD-10-CM | POA: Diagnosis not present

## 2018-07-04 DIAGNOSIS — Z0000 Encounter for general adult medical examination without abnormal findings: Secondary | ICD-10-CM

## 2018-07-04 DIAGNOSIS — M810 Age-related osteoporosis without current pathological fracture: Secondary | ICD-10-CM | POA: Diagnosis not present

## 2018-07-04 DIAGNOSIS — E785 Hyperlipidemia, unspecified: Secondary | ICD-10-CM | POA: Diagnosis not present

## 2018-07-04 DIAGNOSIS — K219 Gastro-esophageal reflux disease without esophagitis: Secondary | ICD-10-CM

## 2018-07-04 DIAGNOSIS — M8588 Other specified disorders of bone density and structure, other site: Secondary | ICD-10-CM | POA: Diagnosis not present

## 2018-07-04 DIAGNOSIS — J0101 Acute recurrent maxillary sinusitis: Secondary | ICD-10-CM

## 2018-07-04 DIAGNOSIS — J3089 Other allergic rhinitis: Secondary | ICD-10-CM

## 2018-07-04 MED ORDER — FAMOTIDINE 20 MG PO TABS: 20.0000 mg | ORAL_TABLET | Freq: Two times a day (BID) | ORAL | 1 refills | Status: DC

## 2018-07-04 MED ORDER — LORATADINE 10 MG PO TABS: 10.0000 mg | ORAL_TABLET | Freq: Every day | ORAL | 11 refills | Status: DC

## 2018-07-04 MED ORDER — PRAVASTATIN SODIUM 40 MG PO TABS: 40.0000 mg | ORAL_TABLET | Freq: Every day | ORAL | 3 refills | Status: DC

## 2018-07-04 MED ORDER — FLUTICASONE PROPIONATE 50 MCG/ACT NA SUSP: 1.0000 | Freq: Two times a day (BID) | NASAL | 6 refills | Status: DC | PRN

## 2018-07-04 NOTE — Progress Notes (Signed)
  Subjective:   Patient ID: Uniqua Macdonald, female    DOB: [birth year], 72 y.o.   MRN: [id number] CC: Annual Exam  HPI: Rebecca Macdonald is a 72 y.o. female   Feeling well overall.  Walking regularly.  Appetite is been good.  No recent fevers.  About a week ago she noticed a bump on right labia.  Was slightly red.  When she wiped one day there is a scant amount of blood on the tissue paper she thinks was coming from the bump.  The bump was hurting at first, after it drained did not have any pain.  It is not felt in the last couple of days.  Mammo: needed extra images from mammo done 08/2016 Colonoscopy: due 05/2019  Hyperlipidemia: Taking pravastatin regularly.  Allergy symptoms: Like refill on Claritin  GERD: Not able to take Zantac anymore because of recall.  Asked for replacement.  Relevant past medical, surgical, family and social history reviewed. Allergies and medications reviewed and updated. Social History   Tobacco Use  Smoking Status Former Smoker  . Last attempt to quit: 03/13/2009  . Years since quitting: 9.3  Smokeless Tobacco Never Used   ROS: All systems negative other than what is in HPI  Objective:    BP 136/81   Pulse 87   Temp 97.7 F (36.5 C) (Oral)   Ht 4\' 10"  (1.473 m)   Wt 123 lb (55.8 kg)   BMI 25.71 kg/m   Wt Readings from Last 3 Encounters:  07/04/18 123 lb (55.8 kg)  07/01/17 115 lb 9.6 oz (52.4 kg)  11/25/16 114 lb (51.7 kg)    Gen: NAD, alert, cooperative with exam, NCAT EYES: EOMI, no conjunctival injection, or no icterus ENT:  TMs pearly gray b/l, OP without erythema LYMPH: no cervical LAD CV: NRRR, normal S1/S2, no murmur, distal pulses 2+ b/l Resp: CTABL, no wheezes, normal WOB Abd: +BS, soft, NTND. no guarding or organomegaly Ext: No edema, warm Neuro: Alert and oriented, strength equal b/l UE and LE, coordination grossly normal MSK: normal muscle bulk GU: Normal external female genitalia.  No skin lesions, bumps  Assessment & Plan:    Rebecca Macdonald was seen today for annual exam.  Diagnoses and all orders for this visit:  Encounter for preventive health examination  Hyperlipidemia, unspecified hyperlipidemia type Stable, continue below -     pravastatin (PRAVACHOL) 40 MG tablet; Take 1 tablet (40 mg total) by mouth daily. -     Basic Metabolic Panel  Post-menopausal -     DG WRFM DEXA  Gastroesophageal reflux disease, esophagitis presence not specified Ongoing symptoms, start below, take as needed. -     famotidine (PEPCID) 20 MG tablet; Take 1 tablet (20 mg total) by mouth 2 (two) times daily.  Allergic rhinitis due to other allergic trigger, unspecified seasonality Occasional symptoms, take below as needed. -     loratadine (CLARITIN) 10 MG tablet; Take 1 tablet (10 mg total) by mouth daily. -     fluticasone (FLONASE) 50 MCG/ACT nasal spray; Place 1 spray into both nostrils 2 (two) times daily as needed for allergies or rhinitis.  Abnormal mammogram -     MM Digital Screening; Future   Follow up plan: Return in about 1 year (around 07/05/2019). Assunta Found, MD [place]

## 2018-07-05 LAB — BASIC METABOLIC PANEL
BUN / CREAT RATIO: 18 (ref 12–28)
BUN: 11 mg/dL (ref 8–27)
CHLORIDE: 103 mmol/L (ref 96–106)
CO2: 26 mmol/L (ref 20–29)
Calcium: 9.2 mg/dL (ref 8.7–10.3)
Creatinine, Ser: 0.61 mg/dL (ref 0.57–1.00)
GFR calc Af Amer: 106 mL/min/{1.73_m2} (ref >59)
GFR calc non Af Amer: 92 mL/min/{1.73_m2} (ref >59)
GLUCOSE: 106 mg/dL — AB (ref 65–99)
POTASSIUM: 3.8 mmol/L (ref 3.5–5.2)
SODIUM: 144 mmol/L (ref 134–144)

## 2018-07-06 ENCOUNTER — Encounter: Payer: Self-pay | Admitting: Pediatrics

## 2018-07-06 ENCOUNTER — Ambulatory Visit (INDEPENDENT_AMBULATORY_CARE_PROVIDER_SITE_OTHER): Payer: Medicare HMO | Admitting: Pediatrics

## 2018-07-06 VITALS — BP 124/74 | HR 71 | Temp 97.6°F | Ht <= 58 in | Wt 123.8 lb

## 2018-07-06 DIAGNOSIS — M810 Age-related osteoporosis without current pathological fracture: Secondary | ICD-10-CM

## 2018-07-06 MED ORDER — ALENDRONATE SODIUM 70 MG PO TABS: 70.0000 mg | ORAL_TABLET | ORAL | 4 refills | Status: DC

## 2018-07-06 NOTE — Patient Instructions (Signed)
Take vitamin D 2000iu over the counter every day Take calcium 1200mg  over the counter every day

## 2018-07-07 NOTE — Progress Notes (Signed)
  Subjective:   Patient ID: Rebecca Macdonald, female    DOB: 1946/02/03, 72 y.o.   MRN: [id number] CC: Dexa scan  HPI: Rebecca Macdonald is a 72 y.o. female   Recent DEXA scan with T score -3.5.  She is never been on treatment for osteoporosis before.  She says her mother had 2 hip fractures in her older age, left her unable to walk.  Patient not regularly taking vitamin D or calcium.  Creatinine 0.61  Relevant past medical, surgical, family and social history reviewed. Allergies and medications reviewed and updated. Social History   Tobacco Use  Smoking Status Former Smoker  . Last attempt to quit: 03/13/2009  . Years since quitting: 9.3  Smokeless Tobacco Never Used   ROS: Per HPI   Objective:    BP 124/74   Pulse 71   Temp 97.6 F (36.4 C) (Oral)   Ht 4\' 10"  (1.473 m)   Wt 123 lb 12.8 oz (56.2 kg)   BMI 25.87 kg/m   Wt Readings from Last 3 Encounters:  07/06/18 123 lb 12.8 oz (56.2 kg)  07/04/18 123 lb (55.8 kg)  07/01/17 115 lb 9.6 oz (52.4 kg)    Gen: NAD, alert, cooperative with exam, NCAT EYES: EOMI, no conjunctival injection, or no icterus CV: NRRR, normal S1/S2 Resp: CTABL, no wheezes, normal WOB Ext: No edema, warm Neuro: Alert and oriented MSK: normal muscle bulk  Assessment & Plan:  Tensley was seen today for dexa scan.  Diagnoses and all orders for this visit:  Osteoporosis, unspecified osteoporosis type, unspecified pathological fracture presence New problem.  Discussed treatment options, side effects.  We will start below.  Add on vitamin D level to make sure replete.  Start taking vitamin D and calcium daily.  If vitamin D level low, will replace with high-dose vitamin D for 8 weeks. -     alendronate (FOSAMAX) 70 MG tablet; Take 1 tablet (70 mg total) by mouth every 7 (seven) days. Take with a full glass of water on an empty stomach.   Follow up plan: Return in about 6 months (around 01/04/2019). Assunta Found, MD [place]

## 2018-07-08 LAB — VITAMIN D 25 HYDROXY (VIT D DEFICIENCY, FRACTURES): Vit D, 25-Hydroxy: 15.5 ng/mL — ABNORMAL LOW (ref 30.0–100.0)

## 2018-07-08 LAB — SPECIMEN STATUS REPORT

## 2018-07-10 ENCOUNTER — Other Ambulatory Visit: Payer: Self-pay | Admitting: Pediatrics

## 2018-07-10 DIAGNOSIS — E559 Vitamin D deficiency, unspecified: Secondary | ICD-10-CM

## 2018-07-10 MED ORDER — VITAMIN D (ERGOCALCIFEROL) 1.25 MG (50000 UNIT) PO CAPS: 50000.0000 [IU] | ORAL_CAPSULE | ORAL | 0 refills | Status: DC

## 2018-07-11 ENCOUNTER — Other Ambulatory Visit: Payer: Self-pay | Admitting: Pediatrics

## 2018-07-11 DIAGNOSIS — N6310 Unspecified lump in the right breast, unspecified quadrant: Secondary | ICD-10-CM

## 2018-07-11 DIAGNOSIS — N6320 Unspecified lump in the left breast, unspecified quadrant: Secondary | ICD-10-CM

## 2018-07-26 ENCOUNTER — Ambulatory Visit (INDEPENDENT_AMBULATORY_CARE_PROVIDER_SITE_OTHER): Payer: Medicare HMO

## 2018-07-26 ENCOUNTER — Ambulatory Visit: Payer: Medicare HMO | Admitting: Pharmacist Clinician (PhC)/ Clinical Pharmacy Specialist

## 2018-07-26 DIAGNOSIS — Z23 Encounter for immunization: Secondary | ICD-10-CM

## 2018-08-01 ENCOUNTER — Ambulatory Visit (HOSPITAL_COMMUNITY)
Admission: RE | Admit: 2018-08-01 | Discharge: 2018-08-01 | Disposition: A | Discharge status: Home / Self Care | Payer: Medicare HMO | Source: Ambulatory Visit | Attending: Pediatrics | Admitting: Pediatrics

## 2018-08-01 DIAGNOSIS — R922 Inconclusive mammogram: Secondary | ICD-10-CM | POA: Diagnosis not present

## 2018-08-01 DIAGNOSIS — N6320 Unspecified lump in the left breast, unspecified quadrant: Secondary | ICD-10-CM | POA: Insufficient documentation

## 2018-08-01 DIAGNOSIS — N6310 Unspecified lump in the right breast, unspecified quadrant: Secondary | ICD-10-CM | POA: Diagnosis not present

## 2018-08-01 DIAGNOSIS — R928 Other abnormal and inconclusive findings on diagnostic imaging of breast: Secondary | ICD-10-CM | POA: Diagnosis not present

## 2018-08-01 DIAGNOSIS — N6312 Unspecified lump in the right breast, upper inner quadrant: Secondary | ICD-10-CM | POA: Diagnosis not present

## 2018-08-23 ENCOUNTER — Telehealth: Payer: Self-pay | Admitting: Pediatrics

## 2018-08-23 NOTE — Telephone Encounter (Signed)
Aware.  Go to pharmacy for prescription strength vitamin D and take one pi;ll weekly.

## 2018-08-25 ENCOUNTER — Telehealth: Payer: Self-pay | Admitting: Pediatrics

## 2018-08-25 ENCOUNTER — Other Ambulatory Visit: Payer: Self-pay | Admitting: *Deleted

## 2018-08-25 DIAGNOSIS — E559 Vitamin D deficiency, unspecified: Secondary | ICD-10-CM

## 2018-08-25 MED ORDER — VITAMIN D (ERGOCALCIFEROL) 1.25 MG (50000 UNIT) PO CAPS: 50000.0000 [IU] | ORAL_CAPSULE | ORAL | 0 refills | Status: DC

## 2018-08-25 NOTE — Telephone Encounter (Signed)
Patient aware to continue fosamax and Vit D resent to [place].  Sent to wrong pharmacy initially

## 2018-10-17 ENCOUNTER — Telehealth: Payer: Self-pay | Admitting: Family Medicine

## 2018-10-17 NOTE — Telephone Encounter (Signed)
Per last lab result note.  Patient was to only take 8 weeks of Vit D 50000 then to switch to OTC.  Patient aware and verbalized understanding

## 2019-01-16 ENCOUNTER — Other Ambulatory Visit: Payer: Self-pay

## 2019-01-16 ENCOUNTER — Ambulatory Visit (INDEPENDENT_AMBULATORY_CARE_PROVIDER_SITE_OTHER): Payer: Medicare HMO | Admitting: Family Medicine

## 2019-01-16 ENCOUNTER — Encounter: Payer: Self-pay | Admitting: Family Medicine

## 2019-01-16 ENCOUNTER — Ambulatory Visit: Payer: Medicare HMO | Admitting: Pediatrics

## 2019-01-16 VITALS — BP 129/81 | HR 98 | Temp 98.0°F | Ht <= 58 in | Wt 122.0 lb

## 2019-01-16 DIAGNOSIS — E870 Hyperosmolality and hypernatremia: Secondary | ICD-10-CM

## 2019-01-16 DIAGNOSIS — J301 Allergic rhinitis due to pollen: Secondary | ICD-10-CM

## 2019-01-16 DIAGNOSIS — R062 Wheezing: Secondary | ICD-10-CM | POA: Diagnosis not present

## 2019-01-16 DIAGNOSIS — E559 Vitamin D deficiency, unspecified: Secondary | ICD-10-CM | POA: Diagnosis not present

## 2019-01-16 DIAGNOSIS — M810 Age-related osteoporosis without current pathological fracture: Secondary | ICD-10-CM

## 2019-01-16 DIAGNOSIS — Z780 Asymptomatic menopausal state: Secondary | ICD-10-CM

## 2019-01-16 DIAGNOSIS — E782 Mixed hyperlipidemia: Secondary | ICD-10-CM

## 2019-01-16 DIAGNOSIS — K219 Gastro-esophageal reflux disease without esophagitis: Secondary | ICD-10-CM | POA: Diagnosis not present

## 2019-01-16 DIAGNOSIS — Z1239 Encounter for other screening for malignant neoplasm of breast: Secondary | ICD-10-CM | POA: Diagnosis not present

## 2019-01-16 DIAGNOSIS — Z1211 Encounter for screening for malignant neoplasm of colon: Secondary | ICD-10-CM | POA: Diagnosis not present

## 2019-01-16 MED ORDER — ALENDRONATE SODIUM 70 MG PO TABS: 70.0000 mg | ORAL_TABLET | ORAL | 4 refills | Status: DC

## 2019-01-16 MED ORDER — BUDESONIDE-FORMOTEROL FUMARATE 80-4.5 MCG/ACT IN AERO: 2.0000 | INHALATION_SPRAY | Freq: Two times a day (BID) | RESPIRATORY_TRACT | 3 refills | Status: DC

## 2019-01-16 MED ORDER — PRAVASTATIN SODIUM 40 MG PO TABS: 40.0000 mg | ORAL_TABLET | Freq: Every day | ORAL | 3 refills | Status: DC

## 2019-01-16 MED ORDER — LORATADINE 10 MG PO TABS: 10.0000 mg | ORAL_TABLET | Freq: Every day | ORAL | 11 refills | Status: DC

## 2019-01-16 MED ORDER — FLUTICASONE PROPIONATE 50 MCG/ACT NA SUSP: 1.0000 | Freq: Two times a day (BID) | NASAL | 6 refills | Status: DC | PRN

## 2019-01-16 MED ORDER — FAMOTIDINE 20 MG PO TABS: 20.0000 mg | ORAL_TABLET | Freq: Two times a day (BID) | ORAL | 1 refills | Status: DC

## 2019-01-16 NOTE — Progress Notes (Signed)
Subjective:  Patient ID: Rebecca Macdonald, female    DOB: 08/12/1946, 73 y.o.   MRN: [id number]  Chief Complaint:  Medical Management of Chronic Issues (six month recheck)   HPI: Madhuri Vacca is a 73 y.o. female presenting on 01/16/2019 for Medical Management of Chronic Issues (six month recheck)   1. Vitamin D deficiency  Pt is taking daily repletion therapy. States she is doing well with medications. No recent fractures. Compliant with medications.   2. Mixed hyperlipidemia  Does try to watch diet. Is very active during the day. No organized exercise. Compliant with medications without associated side effects. No arthralgias or myalgias.   3. Age-related osteoporosis without current pathological fracture  No recent falls or injuries. Compliant with medications without associated side effects. Pt is postmenopausal.    4. Seasonal allergic rhinitis due to pollen  Worse during seasonal changes. Symptoms are well controlled with Flonase and Claritin. No fever, chills, cough, or shortness of breath. Rhinorrhea and sneezing occasionally.    5. Gastroesophageal reflux disease without esophagitis  Doing well on current medications. No hemoptysis, cough, sore throat, dysphagia, or voice change. No change in stool color.    6. Wheezing  Intermittent over the last few months. Worse with exertion. No shortness of breath or cough. Quit smoking over 9 years ago. No previous diagnosis of asthma or COPD. No sputum production, fever, chills, confusion, or weakness. No night time awakenings.      Relevant past medical, surgical, family, and social history reviewed and updated as indicated.  Allergies and medications reviewed and updated.   Past Medical History:  Diagnosis Date  . Asthma   . Cataract   . Hypertension     Past Surgical History:  Procedure Laterality Date  . CHOLECYSTECTOMY  2013  . COLONOSCOPY N/A 06/15/2016   Procedure: COLONOSCOPY;  Surgeon: Daneil Dolin, MD;  Location: [facility]  END[facility];  Service: Endoscopy;  Laterality: N/A;  1215  . POLYPECTOMY  06/15/2016   Procedure: POLYPECTOMY;  Surgeon: Daneil Dolin, MD;  Location: [facility];  Service: Endoscopy;;  colon    Social History   Socioeconomic History  . Marital status: Widowed    Spouse name: Not on file  . Number of children: Not on file  . Years of education: Not on file  . Highest education level: Not on file  Occupational History  . Not on file  Social Needs  . Financial resource strain: Not on file  . Food insecurity:    Worry: Not on file    Inability: Not on file  . Transportation needs:    Medical: Not on file    Non-medical: Not on file  Tobacco Use  . Smoking status: Former Smoker    Last attempt to quit: 03/13/2009    Years since quitting: 9.8  . Smokeless tobacco: Never Used  Substance and Sexual Activity  . Alcohol use: No  . Drug use: No  . Sexual activity: Yes  Lifestyle  . Physical activity:    Days per week: Not on file    Minutes per session: Not on file  . Stress: Not on file  Relationships  . Social connections:    Talks on phone: Not on file    Gets together: Not on file    Attends religious service: Not on file    Active member of club or organization: Not on file    Attends meetings of clubs or organizations: Not on file  Relationship status: Not on file  . Intimate partner violence:    Fear of current or ex partner: Not on file    Emotionally abused: Not on file    Physically abused: Not on file    Forced sexual activity: Not on file  Other Topics Concern  . Not on file  Social History Narrative  . Not on file    Outpatient Encounter Medications as of 01/16/2019  Medication Sig  . acetaminophen (TYLENOL) 500 MG tablet Take 500 mg by mouth every 6 (six) hours as needed for moderate pain or headache.  . alendronate (FOSAMAX) 70 MG tablet Take 1 tablet (70 mg total) by mouth every 7 (seven) days. Take with a full glass of water on an empty stomach.  .  cholecalciferol (VITAMIN D3) 25 MCG (1000 UT) tablet Take 2,000 Units by mouth daily.  . famotidine (PEPCID) 20 MG tablet Take 1 tablet (20 mg total) by mouth 2 (two) times daily.  . fluticasone (FLONASE) 50 MCG/ACT nasal spray Place 1 spray into both nostrils 2 (two) times daily as needed for allergies or rhinitis.  Marland Kitchen loratadine (CLARITIN) 10 MG tablet Take 1 tablet (10 mg total) by mouth daily.  . meclizine (ANTIVERT) 25 MG tablet Take 25 mg by mouth daily as needed for dizziness.  . pravastatin (PRAVACHOL) 40 MG tablet Take 1 tablet (40 mg total) by mouth daily.  . [DISCONTINUED] alendronate (FOSAMAX) 70 MG tablet Take 1 tablet (70 mg total) by mouth every 7 (seven) days. Take with a full glass of water on an empty stomach.  . [DISCONTINUED] famotidine (PEPCID) 20 MG tablet Take 1 tablet (20 mg total) by mouth 2 (two) times daily.  . [DISCONTINUED] fluticasone (FLONASE) 50 MCG/ACT nasal spray Place 1 spray into both nostrils 2 (two) times daily as needed for allergies or rhinitis.  . [DISCONTINUED] loratadine (CLARITIN) 10 MG tablet Take 1 tablet (10 mg total) by mouth daily.  . [DISCONTINUED] pravastatin (PRAVACHOL) 40 MG tablet Take 1 tablet (40 mg total) by mouth daily.  . [DISCONTINUED] Vitamin D, Ergocalciferol, (DRISDOL) 1.25 MG (50000 UT) CAPS capsule Take 1 capsule (50,000 Units total) by mouth every 7 (seven) days.  . budesonide-formoterol (SYMBICORT) 80-4.5 MCG/ACT inhaler Inhale 2 puffs into the lungs 2 (two) times daily.   No facility-administered encounter medications on file as of 01/16/2019.     No Known Allergies  Review of Systems  Constitutional: Negative for activity change, appetite change, chills, diaphoresis, fatigue, fever and unexpected weight change.  HENT: Positive for congestion, postnasal drip, rhinorrhea and sneezing. Negative for ear discharge, ear pain, mouth sores, nosebleeds, sinus pressure, sinus pain, sore throat, tinnitus, trouble swallowing and voice change.    Eyes: Negative for photophobia and visual disturbance.  Respiratory: Positive for wheezing. Negative for cough, choking, chest tightness, shortness of breath and stridor.   Cardiovascular: Negative for chest pain, palpitations and leg swelling.  Gastrointestinal: Negative for abdominal pain, anal bleeding, blood in stool, constipation, diarrhea, nausea and vomiting.  Endocrine: Negative for cold intolerance, heat intolerance, polydipsia, polyphagia and polyuria.  Genitourinary: Negative for decreased urine volume and difficulty urinating.  Musculoskeletal: Negative for arthralgias and myalgias.  Skin: Negative for color change, pallor and rash.  Neurological: Negative for dizziness, tremors, seizures, syncope, facial asymmetry, speech difficulty, weakness, light-headedness, numbness and headaches.  Hematological: Does not bruise/bleed easily.  Psychiatric/Behavioral: Negative for confusion.  All other systems reviewed and are negative.       Objective:  BP 129/81   Pulse  98   Temp 98 F (36.7 C) (Oral)   Ht '4\' 10"'$  (1.473 m)   Wt 122 lb (55.3 kg)   BMI 25.50 kg/m    Wt Readings from Last 3 Encounters:  01/16/19 122 lb (55.3 kg)  07/06/18 123 lb 12.8 oz (56.2 kg)  07/04/18 123 lb (55.8 kg)    Physical Exam Vitals signs and nursing note reviewed.  Constitutional:      General: She is not in acute distress.    Appearance: Normal appearance. She is well-developed and well-groomed. She is not ill-appearing, toxic-appearing or diaphoretic.  HENT:     Head: Normocephalic and atraumatic.     Jaw: There is normal jaw occlusion.     Right Ear: Hearing normal.     Left Ear: Hearing normal.     Nose: Nose normal.     Mouth/Throat:     Lips: Pink.     Mouth: Mucous membranes are moist.     Pharynx: Oropharynx is clear. Uvula midline.  Eyes:     General: Lids are normal.     Extraocular Movements: Extraocular movements intact.     Conjunctiva/sclera: Conjunctivae normal.      Pupils: Pupils are equal, round, and reactive to light.  Neck:     Musculoskeletal: Normal range of motion and neck supple.     Thyroid: No thyroid mass, thyromegaly or thyroid tenderness.     Vascular: No carotid bruit or JVD.     Trachea: Trachea and phonation normal.  Cardiovascular:     Rate and Rhythm: Normal rate and regular rhythm.     Chest Wall: PMI is not displaced.     Pulses: Normal pulses.     Heart sounds: Normal heart sounds. No murmur. No friction rub. No gallop.   Pulmonary:     Effort: Pulmonary effort is normal. No respiratory distress.     Breath sounds: Normal breath sounds. No wheezing.  Abdominal:     General: Bowel sounds are normal. There is no distension or abdominal bruit.     Palpations: Abdomen is soft. There is no hepatomegaly or splenomegaly.     Tenderness: There is no abdominal tenderness. There is no right CVA tenderness or left CVA tenderness.     Hernia: No hernia is present.  Musculoskeletal: Normal range of motion.     Right lower leg: No edema.     Left lower leg: No edema.  Lymphadenopathy:     Cervical: No cervical adenopathy.  Skin:    General: Skin is warm and dry.     Capillary Refill: Capillary refill takes less than 2 seconds.     Coloration: Skin is not cyanotic, jaundiced or pale.     Findings: No rash.  Neurological:     General: No focal deficit present.     Mental Status: She is alert and oriented to person, place, and time.     Cranial Nerves: Cranial nerves are intact.     Sensory: Sensation is intact.     Motor: Motor function is intact.     Coordination: Coordination is intact.     Gait: Gait is intact.     Deep Tendon Reflexes: Reflexes are normal and symmetric.  Psychiatric:        Attention and Perception: Attention and perception normal.        Mood and Affect: Mood and affect normal.        Speech: Speech normal.        Behavior: Behavior  normal. Behavior is cooperative.        Thought Content: Thought content  normal.        Cognition and Memory: Cognition and memory normal.        Judgment: Judgment normal.     Results for orders placed or performed in visit on 02/40/97  Basic Metabolic Panel  Result Value Ref Range   Glucose 106 (H) 65 - 99 mg/dL   BUN 11 8 - 27 mg/dL   Creatinine, Ser 0.61 0.57 - 1.00 mg/dL   GFR calc non Af Amer 92 >59 mL/min/1.73   GFR calc Af Amer 106 >59 mL/min/1.73   BUN/Creatinine Ratio 18 12 - 28   Sodium 144 134 - 144 mmol/L   Potassium 3.8 3.5 - 5.2 mmol/L   Chloride 103 96 - 106 mmol/L   CO2 26 20 - 29 mmol/L   Calcium 9.2 8.7 - 10.3 mg/dL  VITAMIN D 25 Hydroxy (Vit-D Deficiency, Fractures)  Result Value Ref Range   Vit D, 25-Hydroxy 15.5 (L) 30.0 - 100.0 ng/mL  Specimen status report  Result Value Ref Range   specimen status report Comment        Pertinent labs & imaging results that were available during my care of the patient were reviewed by me and considered in my medical decision making.  Assessment & Plan:  Mafalda was seen today for medical management of chronic issues.  Diagnoses and all orders for this visit:  Vitamin D deficiency Taking daily repletion therapy. Will check level today.  -     VITAMIN D 25 Hydroxy (Vit-D Deficiency, Fractures)  Mixed hyperlipidemia Diet and exercise encouraged. Compliant with medications and tolerating them well. Continue below. Labs pending.  -     pravastatin (PRAVACHOL) 40 MG tablet; Take 1 tablet (40 mg total) by mouth daily. -     CMP14+EGFR -     Lipid panel  Age-related osteoporosis without current pathological fracture Last DEXA in 2019, will repeat in 2021. Tolerating alendronate well. No jaw pain, loose teeth, or trouble chewing. Is not taking a calcium supplement, will initiate today.  -     alendronate (FOSAMAX) 70 MG tablet; Take 1 tablet (70 mg total) by mouth every 7 (seven) days. Take with a full glass of water on an empty stomach. -     CMP14+EGFR  Seasonal allergic rhinitis due to  pollen Well controlled, continue below.  -     fluticasone (FLONASE) 50 MCG/ACT nasal spray; Place 1 spray into both nostrils 2 (two) times daily as needed for allergies or rhinitis. -     loratadine (CLARITIN) 10 MG tablet; Take 1 tablet (10 mg total) by mouth daily.  Gastroesophageal reflux disease without esophagitis Well controlled with current medications. Continue below.  -     famotidine (PEPCID) 20 MG tablet; Take 1 tablet (20 mg total) by mouth 2 (two) times daily. -     CBC with Differential/Platelet  Wheezing Previous smoker, quit over 9 years ago. No diagnosis of COPD or asthma. Recently started having exertional wheezing that is becoming more frequent. No night time awakenings. No shortness of breath. Will trial below. Report any new or worsening symptoms.  -     budesonide-formoterol (SYMBICORT) 80-4.5 MCG/ACT inhaler; Inhale 2 puffs into the lungs 2 (two) times daily.  Post-menopausal Fatigue and hot flashes. Not taking a calcium supplement. Will initiate today. -     CMP14+EGFR -     CBC with Differential/Platelet -  Lipid panel -     TSH  Screening for malignant neoplasm of breast Due for mammogram, will order today.  -     MM 3D SCREEN BREAST BILATERAL; Future  Screening for malignant neoplasm of colon Due for colonoscopy, will place referral today.  -     Ambulatory referral to Gastroenterology     Continue all other maintenance medications.  Follow up plan: Return in about 6 weeks (around 02/27/2019), or if symptoms worsen or fail to improve, for wheezing.   The above assessment and management plan was discussed with the patient. The patient verbalized understanding of and has agreed to the management plan. Patient is aware to call the clinic if symptoms persist or worsen. Patient is aware when to return to the clinic for a follow-up visit. Patient educated on when it is appropriate to go to the emergency department.   Monia Pouch, FNP-C [facility] [phone]

## 2019-01-17 LAB — CMP14+EGFR
ALT: 26 IU/L (ref 0–32)
AST: 33 IU/L (ref 0–40)
Albumin/Globulin Ratio: 1.7 (ref 1.2–2.2)
Albumin: 4.3 g/dL (ref 3.8–4.8)
Alkaline Phosphatase: 54 IU/L (ref 39–117)
BUN/Creatinine Ratio: 11 — ABNORMAL LOW (ref 12–28)
BUN: 8 mg/dL (ref 8–27)
Bilirubin Total: 0.5 mg/dL (ref 0.0–1.2)
CO2: 27 mmol/L (ref 20–29)
Calcium: 9.6 mg/dL (ref 8.7–10.3)
Chloride: 100 mmol/L (ref 96–106)
Creatinine, Ser: 0.73 mg/dL (ref 0.57–1.00)
GFR calc Af Amer: 96 mL/min/{1.73_m2} (ref >59)
GFR calc non Af Amer: 84 mL/min/{1.73_m2} (ref >59)
Globulin, Total: 2.6 g/dL (ref 1.5–4.5)
Glucose: 106 mg/dL — ABNORMAL HIGH (ref 65–99)
Potassium: 3.5 mmol/L (ref 3.5–5.2)
Sodium: 145 mmol/L — ABNORMAL HIGH (ref 134–144)
Total Protein: 6.9 g/dL (ref 6.0–8.5)

## 2019-01-17 LAB — CBC WITH DIFFERENTIAL/PLATELET
Basophils Absolute: 0 10*3/uL (ref 0.0–0.2)
Basos: 0 %
EOS (ABSOLUTE): 0.1 10*3/uL (ref 0.0–0.4)
Eos: 1 %
Hematocrit: 42.3 % (ref 34.0–46.6)
Hemoglobin: 14.5 g/dL (ref 11.1–15.9)
Immature Grans (Abs): 0 10*3/uL (ref 0.0–0.1)
Immature Granulocytes: 0 %
Lymphocytes Absolute: 2.2 10*3/uL (ref 0.7–3.1)
Lymphs: 29 %
MCH: 30.8 pg (ref 26.6–33.0)
MCHC: 34.3 g/dL (ref 31.5–35.7)
MCV: 90 fL (ref 79–97)
Monocytes Absolute: 0.5 10*3/uL (ref 0.1–0.9)
Monocytes: 7 %
Neutrophils Absolute: 4.8 10*3/uL (ref 1.4–7.0)
Neutrophils: 63 %
Platelets: 192 10*3/uL (ref 150–450)
RBC: 4.71 x10E6/uL (ref 3.77–5.28)
RDW: 12.3 % (ref 11.7–15.4)
WBC: 7.7 10*3/uL (ref 3.4–10.8)

## 2019-01-17 LAB — VITAMIN D 25 HYDROXY (VIT D DEFICIENCY, FRACTURES): Vit D, 25-Hydroxy: 46.9 ng/mL (ref 30.0–100.0)

## 2019-01-17 LAB — LIPID PANEL
Chol/HDL Ratio: 3.3 ratio (ref 0.0–4.4)
Cholesterol, Total: 143 mg/dL (ref 100–199)
HDL: 43 mg/dL (ref >39)
LDL Calculated: 72 mg/dL (ref 0–99)
Triglycerides: 139 mg/dL (ref 0–149)
VLDL Cholesterol Cal: 28 mg/dL (ref 5–40)

## 2019-01-17 LAB — TSH: TSH: 2.89 u[IU]/mL (ref 0.450–4.500)

## 2019-01-17 NOTE — Addendum Note (Signed)
Addended by: Baruch Gouty on: 01/17/2019 11:54 AM   Modules accepted: Orders

## 2019-01-17 NOTE — Addendum Note (Signed)
Addended byCarrolyn Leigh on: 01/17/2019 12:52 PM   Modules accepted: Orders

## 2019-01-23 ENCOUNTER — Other Ambulatory Visit: Payer: Medicare HMO

## 2019-01-23 ENCOUNTER — Other Ambulatory Visit: Payer: Self-pay

## 2019-01-23 DIAGNOSIS — E870 Hyperosmolality and hypernatremia: Secondary | ICD-10-CM | POA: Diagnosis not present

## 2019-01-24 LAB — CMP14+EGFR
ALT: 26 IU/L (ref 0–32)
AST: 27 IU/L (ref 0–40)
Albumin/Globulin Ratio: 1.7 (ref 1.2–2.2)
Albumin: 4.1 g/dL (ref 3.8–4.8)
Alkaline Phosphatase: 51 IU/L (ref 39–117)
BUN/Creatinine Ratio: 24 (ref 12–28)
BUN: 20 mg/dL (ref 8–27)
Bilirubin Total: 0.3 mg/dL (ref 0.0–1.2)
CO2: 24 mmol/L (ref 20–29)
Calcium: 8.9 mg/dL (ref 8.7–10.3)
Chloride: 101 mmol/L (ref 96–106)
Creatinine, Ser: 0.84 mg/dL (ref 0.57–1.00)
GFR calc Af Amer: 81 mL/min/{1.73_m2} (ref >59)
GFR calc non Af Amer: 71 mL/min/{1.73_m2} (ref >59)
Globulin, Total: 2.4 g/dL (ref 1.5–4.5)
Glucose: 106 mg/dL — ABNORMAL HIGH (ref 65–99)
Potassium: 3.8 mmol/L (ref 3.5–5.2)
Sodium: 142 mmol/L (ref 134–144)
Total Protein: 6.5 g/dL (ref 6.0–8.5)

## 2019-01-26 ENCOUNTER — Other Ambulatory Visit: Payer: Self-pay

## 2019-01-26 ENCOUNTER — Ambulatory Visit (INDEPENDENT_AMBULATORY_CARE_PROVIDER_SITE_OTHER): Payer: Medicare HMO | Admitting: *Deleted

## 2019-01-26 VITALS — Ht <= 58 in | Wt 122.0 lb

## 2019-01-26 DIAGNOSIS — Z0000 Encounter for general adult medical examination without abnormal findings: Secondary | ICD-10-CM

## 2019-01-26 NOTE — Patient Instructions (Signed)
Rebecca Macdonald , Thank you for taking time to come for your Medicare Wellness Visit. I appreciate your ongoing commitment to your health goals. Please review the following plan we discussed and let me know if I can assist you in the future.   These are the goals we discussed: Goals    . Exercise 3x per week (30 min per time)     Try to exercise for at least 30 minutes, 3 times weekly       This is a list of the screening recommended for you and due dates:  Health Maintenance  Topic Date Due  . Tetanus Vaccine  07/05/2019*  . Mammogram  01/31/2019  . Flu Shot  03/17/2019  . Colon Cancer Screening  06/16/2019  . DEXA scan (bone density measurement)  Completed  .  Hepatitis C: One time screening is recommended by Center for Disease Control  (CDC) for  adults born from [birth year] through 1963.   Completed  . Pneumonia vaccines  Completed  *Topic was postponed. The date shown is not the original due date.     Advance Directive  Advance directives are legal documents that let you make choices ahead of time about your health care and medical treatment in case you become unable to communicate for yourself. Advance directives are a way for you to communicate your wishes to family, friends, and health care providers. This can help convey your decisions about end-of-life care if you become unable to communicate. Discussing and writing advance directives should happen over time rather than all at once. Advance directives can be changed depending on your situation and what you want, even after you have signed the advance directives. If you do not have an advance directive, some states assign family decision makers to act on your behalf based on how closely you are related to them. Each state has its own laws regarding advance directives. You may want to check with your health care provider, attorney, or state representative about the laws in your state. There are different types of advance directives, such as:   Medical power of attorney.  Living will.  Do not resuscitate (DNR) or do not attempt resuscitation (DNAR) order. Health care proxy and medical power of attorney A health care proxy, also called a health care agent, is a person who is appointed to make medical decisions for you in cases in which you are unable to make the decisions yourself. Generally, people choose someone they know well and trust to represent their preferences. Make sure to ask this person for an agreement to act as your proxy. A proxy may have to exercise judgment in the event of a medical decision for which your wishes are not known. A medical power of attorney is a legal document that names your health care proxy. Depending on the laws in your state, after the document is written, it may also need to be:  Signed.  Notarized.  Dated.  Copied.  Witnessed.  Incorporated into your medical record. You may also want to appoint someone to manage your financial affairs in a situation in which you are unable to do so. This is called a durable power of attorney for finances. It is a separate legal document from the durable power of attorney for health care. You may choose the same person or someone different from your health care proxy to act as your agent in financial matters. If you do not appoint a proxy, or if there is a concern that the  proxy is not acting in your best interests, a court-appointed guardian may be designated to act on your behalf. Living will A living will is a set of instructions documenting your wishes about medical care when you cannot express them yourself. Health care providers should keep a copy of your living will in your medical record. You may want to give a copy to family members or friends. To alert caregivers in case of an emergency, you can place a card in your wallet to let them know that you have a living will and where they can find it. A living will is used if you become:  Terminally ill.   Incapacitated.  Unable to communicate or make decisions. Items to consider in your living will include:  The use or non-use of life-sustaining equipment, such as dialysis machines and breathing machines (ventilators).  A DNR or DNAR order, which is the instruction not to use cardiopulmonary resuscitation (CPR) if breathing or heartbeat stops.  The use or non-use of tube feeding.  Withholding of food and fluids.  Comfort (palliative) care when the goal becomes comfort rather than a cure.  Organ and tissue donation. A living will does not give instructions for distributing your money and property if you should pass away. It is recommended that you seek the advice of a lawyer when writing a will. Decisions about taxes, beneficiaries, and asset distribution will be legally binding. This process can relieve your family and friends of any concerns surrounding disputes or questions that may come up about the distribution of your assets. DNR or DNAR A DNR or DNAR order is a request not to have CPR in the event that your heart stops beating or you stop breathing. If a DNR or DNAR order has not been made and shared, a health care provider will try to help any patient whose heart has stopped or who has stopped breathing. If you plan to have surgery, talk with your health care provider about how your DNR or DNAR order will be followed if problems occur. Summary  Advance directives are the legal documents that allow you to make choices ahead of time about your health care and medical treatment in case you become unable to communicate for yourself.  The process of discussing and writing advance directives should happen over time. You can change the advance directives, even after you have signed them.  Advance directives include DNR or DNAR orders, living wills, and designating an agent as your medical power of attorney. This information is not intended to replace advice given to you by your health  care provider. Make sure you discuss any questions you have with your health care provider. Document Released: 11/09/2007 Document Revised: 06/21/2016 Document Reviewed: 06/21/2016 Elsevier Interactive Patient Education  2019 [employer].

## 2019-01-26 NOTE — Progress Notes (Signed)
MEDICARE ANNUAL WELLNESS VISIT  01/26/2019  Telephone Visit Disclaimer This Medicare AWV was conducted by telephone due to national recommendations for restrictions regarding the COVID-19 Pandemic (e.g. social distancing).  I verified, using two identifiers, that I am speaking with Rebecca Macdonald or their authorized healthcare agent. I discussed the limitations, risks, security, and privacy concerns of performing an evaluation and management service by telephone and the potential availability of an in-person appointment in the future. The patient expressed understanding and agreed to proceed.   Subjective:  Rebecca Macdonald is a 73 y.o. female patient of Rakes, Connye Burkitt, FNP who had a Medicare Annual Wellness Visit today via telephone. [name] is Retired and lives alone. she has 1 child. she reports that she is socially active and does interact with friends/family regularly. she is not physically active and enjoys shopping.  Patient Care Team: Baruch Gouty, FNP as PCP - General (Family Medicine) Gala Romney Cristopher Estimable, MD as Consulting Physician (Gastroenterology)  Advanced Directives 01/26/2019 06/15/2016  Does Patient Have a Medical Advance Directive? No No  Would patient like information on creating a medical advance directive? Yes (MAU/Ambulatory/Procedural Areas - Information given) No - patient declined information    Hospital Utilization Over the Past 12 Months: # of hospitalizations or ER visits: 0 # of surgeries: 0  Review of Systems    Patient reports that her overall health is unchanged compared to last year.  Patient Reported Readings (BP, Pulse, CBG, Weight, etc) none  Review of Systems: History obtained from chart review and the patient General ROS: negative  All other systems negative.  Pain Assessment Pain : No/denies pain     Current Medications & Allergies (verified) Allergies as of 01/26/2019   No Known Allergies     Medication List       Accurate as of January 26, 2019  3:06 PM. If you have any questions, ask your nurse or doctor.        acetaminophen 500 MG tablet Commonly known as: TYLENOL Take 500 mg by mouth every 6 (six) hours as needed for moderate pain or headache.   alendronate 70 MG tablet Commonly known as: FOSAMAX Take 1 tablet (70 mg total) by mouth every 7 (seven) days. Take with a full glass of water on an empty stomach.   budesonide-formoterol 80-4.5 MCG/ACT inhaler Commonly known as: SYMBICORT Inhale 2 puffs into the lungs 2 (two) times daily.   cholecalciferol 25 MCG (1000 UT) tablet Commonly known as: VITAMIN D3 Take 2,000 Units by mouth daily.   famotidine 20 MG tablet Commonly known as: Pepcid Take 1 tablet (20 mg total) by mouth 2 (two) times daily.   fluticasone 50 MCG/ACT nasal spray Commonly known as: FLONASE Place 1 spray into both nostrils 2 (two) times daily as needed for allergies or rhinitis.   loratadine 10 MG tablet Commonly known as: CLARITIN Take 1 tablet (10 mg total) by mouth daily.   meclizine 25 MG tablet Commonly known as: ANTIVERT Take 25 mg by mouth daily as needed for dizziness.   pravastatin 40 MG tablet Commonly known as: PRAVACHOL Take 1 tablet (40 mg total) by mouth daily.       History (reviewed): Past Medical History:  Diagnosis Date  . Asthma   . Cataract   . Hypertension    Past Surgical History:  Procedure Laterality Date  . CHOLECYSTECTOMY  2013  . COLONOSCOPY N/A 06/15/2016   Procedure: COLONOSCOPY;  Surgeon: Daneil Dolin, MD;  [facility];  Service: Endoscopy;  Laterality: N/A;  1215  . POLYPECTOMY  06/15/2016   Procedure: POLYPECTOMY;  Surgeon: Daneil Dolin, MD;  Location: [facility];  Service: Endoscopy;;  colon   Family History  Problem Relation Age of Onset  . Colon polyps Brother        high grade required partial colectomy but not cancer.   . Colon cancer Neg Hx    Social History   Socioeconomic History  . Marital status: Widowed     Spouse name: Not on file  . Number of children: 1  . Years of education: Not on file  . Highest education level: 11th grade  Occupational History  . Occupation: Retired  Scientific laboratory technician  . Financial resource strain: Not hard at all  . Food insecurity    Worry: Never true    Inability: Never true  . Transportation needs    Medical: No    Non-medical: No  Tobacco Use  . Smoking status: Former Smoker    Quit date: 03/13/2009    Years since quitting: 9.8  . Smokeless tobacco: Never Used  Substance and Sexual Activity  . Alcohol use: No  . Drug use: No  . Sexual activity: Yes  Lifestyle  . Physical activity    Days per week: 0 days    Minutes per session: 0 min  . Stress: Not at all  Relationships  . Social connections    Talks on phone: More than three times a week    Gets together: More than three times a week    Attends religious service: Never    Active member of club or organization: No    Attends meetings of clubs or organizations: Never    Relationship status: Widowed  Other Topics Concern  . Not on file  Social History Narrative  . Not on file    Activities of Daily Living In your present state of health, do you have any difficulty performing the following activities: 01/26/2019  Hearing? N  Vision? N  Difficulty concentrating or making decisions? N  Walking or climbing stairs? N  Dressing or bathing? N  Doing errands, shopping? N  Preparing Food and eating ? N  Using the Toilet? N  In the past six months, have you accidently leaked urine? N  Do you have problems with loss of bowel control? N  Managing your Medications? N  Managing your Finances? N  Housekeeping or managing your Housekeeping? N  Some recent data might be hidden    Patient Literacy How often do you need to have someone help you when you read instructions, pamphlets, or other written materials from your doctor or pharmacy?: 1 - Never What is the last grade level you completed in school?: 11th  Grade  Exercise Current Exercise Habits: The patient does not participate in regular exercise at present  Diet Patient reports consuming 3 meals a day and 1 snack(s) a day Patient reports that her primary diet is: Regular Patient reports that she does have regular access to food.   Depression Screen PHQ 2/9 Scores 01/26/2019 01/16/2019 07/06/2018 07/04/2018 07/01/2017 11/25/2016 06/25/2016  PHQ - 2 Score 0 0 0 0 0 0 0     Fall Risk Fall Risk  01/26/2019 01/16/2019 07/06/2018 07/04/2018 07/01/2017  Falls in the past year? 0 0 0 0 No  Number falls in past yr: 0 - - - -  Injury with Fall? 0 - - - -     Objective:  Evelynne Spiers seemed alert and oriented and she participated appropriately during our telephone visit.  Blood Pressure Weight BMI  BP Readings from Last 3 Encounters:  01/16/19 129/81  07/06/18 124/74  07/04/18 136/81   Wt Readings from Last 3 Encounters:  01/26/19 122 lb (55.3 kg)  01/16/19 122 lb (55.3 kg)  07/06/18 123 lb 12.8 oz (56.2 kg)   BMI Readings from Last 1 Encounters:  01/26/19 25.50 kg/m    *Unable to obtain current vital signs, weight, and BMI due to telephone visit type  Hearing/Vision  . Chattie did  seem to have difficulty with hearing/understanding during the telephone conversation . Reports that she has had a formal eye exam by an eye care professional within the past year . Reports that she has not had a formal hearing evaluation within the past year *Unable to fully assess hearing and vision during telephone visit type  Cognitive Function: 6CIT Screen 01/26/2019  What Year? 0 points  What month? 0 points  What time? 0 points  Count back from 20 2 points  Months in reverse 2 points  Repeat phrase 0 points  Total Score 4   (Normal:0-7, Significant for Dysfunction: >8)  Normal Cognitive Function Screening: Yes   Immunization & Health Maintenance Record Immunization History  Administered Date(s) Administered  . Influenza, High Dose Seasonal  PF 07/26/2018  . Influenza,inj,Quad PF,6+ Mos 06/21/2013, 06/25/2016, 07/01/2017  . Pneumococcal Conjugate-13 06/21/2013  . Pneumococcal Polysaccharide-23 04/16/2016    Health Maintenance  Topic Date Due  . TETANUS/TDAP  07/05/2019 (Originally 05/19/1965)  . MAMMOGRAM  01/31/2019  . INFLUENZA VACCINE  03/17/2019  . COLONOSCOPY  06/16/2019  . DEXA SCAN  Completed  . Hepatitis C Screening  Completed  . PNA vac Low Risk Adult  Completed       Assessment  This is a routine wellness examination for [facility].  Health Maintenance: Due or Overdue There are no preventive care reminders to display for this patient.  Ameliah Baskins does not need a referral for Community Assistance: Care Management:   no Social Work:    no Prescription Assistance:  no Nutrition/Diabetes Education:  no   Plan:  Personalized Goals Goals Addressed            This Visit's Progress   . Exercise 3x per week (30 min per time)       Try to exercise for at least 30 minutes, 3 times weekly      Personalized Health Maintenance & Screening Recommendations  Td vaccine Screening mammography  Lung Cancer Screening Recommended: no (Low Dose CT Chest recommended if Age 41-80 years, 30 pack-year currently smoking OR have quit w/in past 15 years) Hepatitis C Screening recommended: no HIV Screening recommended: no  Advanced Directives: Written information was prepared per patient's request.  Referrals & Orders No orders of the defined types were placed in this encounter.   Follow-up Plan . Follow-up with Baruch Gouty, FNP as planned   I have personally reviewed and noted the following in the patient's chart:   . Medical and social history . Use of alcohol, tobacco or illicit drugs  . Current medications and supplements . Functional ability and status . Nutritional status . Physical activity . Advanced directives . List of other physicians . Hospitalizations, surgeries, and ER visits in previous  12 months . Vitals . Screenings to include cognitive, depression, and falls . Referrals and appointments  In addition, I have reviewed and discussed with [facility] certain preventive protocols,  quality metrics, and best practice recommendations. A written personalized care plan for preventive services as well as general preventive health recommendations is available and can be mailed to the patient at her request.      Wardell Heath, LPN  02/12/4764

## 2019-02-27 ENCOUNTER — Ambulatory Visit (INDEPENDENT_AMBULATORY_CARE_PROVIDER_SITE_OTHER): Payer: Medicare HMO | Admitting: Family Medicine

## 2019-02-27 ENCOUNTER — Encounter: Payer: Self-pay | Admitting: Family Medicine

## 2019-02-27 DIAGNOSIS — R062 Wheezing: Secondary | ICD-10-CM | POA: Diagnosis not present

## 2019-02-27 DIAGNOSIS — K219 Gastro-esophageal reflux disease without esophagitis: Secondary | ICD-10-CM | POA: Diagnosis not present

## 2019-02-27 MED ORDER — FAMOTIDINE 20 MG PO TABS: 20.0000 mg | ORAL_TABLET | Freq: Two times a day (BID) | ORAL | 1 refills | Status: DC

## 2019-02-27 NOTE — Progress Notes (Signed)
Virtual Visit via telephone Note Due to COVID-19, visit is conducted virtually and was requested by patient. This visit type was conducted due to national recommendations for restrictions regarding the COVID-19 Pandemic (e.g. social distancing) in an effort to limit this patient's exposure and mitigate transmission in our community. All issues noted in this document were discussed and addressed.  A physical exam was not performed with this format.   I connected with Rebecca Macdonald on 02/27/19 at [facility] by telephone and verified that I am speaking with the correct person using two identifiers. Rebecca Macdonald is currently located at home and no one is currently with them during visit. The provider, Monia Pouch, FNP is located in their office at time of visit.  I discussed the limitations, risks, security and privacy concerns of performing an evaluation and management service by telephone and the availability of in person appointments. I also discussed with the patient that there may be a patient responsible charge related to this service. The patient expressed understanding and agreed to proceed.  Subjective:  Patient ID: Rebecca Macdonald, female    DOB: 1945-12-14, 73 y.o.   MRN: [id number]  Chief Complaint:  Wheezing and Gastroesophageal Reflux   HPI: Rebecca Macdonald is a 73 y.o. female presenting on 02/27/2019 for Wheezing and Gastroesophageal Reflux   Pt following up for wheezing and GERD. Pt states the wheezing has subsided. States she only uses the Symbicort once daily when needed. She states her GERD is well controlled with the Pepcid. No sore throat, dysphagia, voice change, cough, or abdominal pain. No hemoptysis, melena, or hematochezia.     Relevant past medical, surgical, family, and social history reviewed and updated as indicated.  Allergies and medications reviewed and updated.   Past Medical History:  Diagnosis Date  . Asthma   . Cataract   . Hypertension     Past Surgical History:   Procedure Laterality Date  . CHOLECYSTECTOMY  2013  . COLONOSCOPY N/A 06/15/2016   Procedure: COLONOSCOPY;  Surgeon: Daneil Dolin, MD;  [facility];  Service: Endoscopy;  Laterality: N/A;  1215  . POLYPECTOMY  06/15/2016   Procedure: POLYPECTOMY;  Surgeon: Daneil Dolin, MD;  Location: [facility];  Service: Endoscopy;;  colon    Social History   Socioeconomic History  . Marital status: Widowed    Spouse name: Not on file  . Number of children: 1  . Years of education: Not on file  . Highest education level: 11th grade  Occupational History  . Occupation: Retired  Scientific laboratory technician  . Financial resource strain: Not hard at all  . Food insecurity    Worry: Never true    Inability: Never true  . Transportation needs    Medical: No    Non-medical: No  Tobacco Use  . Smoking status: Former Smoker    Quit date: 03/13/2009    Years since quitting: 9.9  . Smokeless tobacco: Never Used  Substance and Sexual Activity  . Alcohol use: No  . Drug use: No  . Sexual activity: Yes  Lifestyle  . Physical activity    Days per week: 0 days    Minutes per session: 0 min  . Stress: Not at all  Relationships  . Social connections    Talks on phone: More than three times a week    Gets together: More than three times a week    Attends religious service: Never    Active member of club or organization: No  Attends meetings of clubs or organizations: Never    Relationship status: Widowed  . Intimate partner violence    Fear of current or ex partner: No    Emotionally abused: No    Physically abused: No    Forced sexual activity: No  Other Topics Concern  . Not on file  Social History Narrative  . Not on file    Outpatient Encounter Medications as of 02/27/2019  Medication Sig  . acetaminophen (TYLENOL) 500 MG tablet Take 500 mg by mouth every 6 (six) hours as needed for moderate pain or headache.  . alendronate (FOSAMAX) 70 MG tablet Take 1 tablet (70 mg total) by  mouth every 7 (seven) days. Take with a full glass of water on an empty stomach.  . budesonide-formoterol (SYMBICORT) 80-4.5 MCG/ACT inhaler Inhale 2 puffs into the lungs 2 (two) times daily.  . cholecalciferol (VITAMIN D3) 25 MCG (1000 UT) tablet Take 2,000 Units by mouth daily.  . famotidine (PEPCID) 20 MG tablet Take 1 tablet (20 mg total) by mouth 2 (two) times daily.  . fluticasone (FLONASE) 50 MCG/ACT nasal spray Place 1 spray into both nostrils 2 (two) times daily as needed for allergies or rhinitis.  Marland Kitchen loratadine (CLARITIN) 10 MG tablet Take 1 tablet (10 mg total) by mouth daily.  . meclizine (ANTIVERT) 25 MG tablet Take 25 mg by mouth daily as needed for dizziness.  . pravastatin (PRAVACHOL) 40 MG tablet Take 1 tablet (40 mg total) by mouth daily.  . [DISCONTINUED] famotidine (PEPCID) 20 MG tablet Take 1 tablet (20 mg total) by mouth 2 (two) times daily.   No facility-administered encounter medications on file as of 02/27/2019.     No Known Allergies  Review of Systems  Constitutional: Negative for chills, fatigue and fever.  HENT: Negative for sore throat, trouble swallowing and voice change.   Respiratory: Negative for cough, choking, shortness of breath and wheezing.   Cardiovascular: Negative for chest pain and palpitations.  Gastrointestinal: Negative for abdominal pain, anal bleeding and blood in stool.  Skin: Negative for color change and pallor.  Neurological: Negative for weakness.  Psychiatric/Behavioral: Negative for confusion.  All other systems reviewed and are negative.        Observations/Objective: No vital signs or physical exam, this was a telephone or virtual health encounter.  Pt alert and oriented, answers all questions appropriately, and able to speak in full sentences.    Assessment and Plan: Rebecca Macdonald was seen today for wheezing and gastroesophageal reflux.  Diagnoses and all orders for this visit:  Wheezing Has resolved with the use of Symbicort  as needed. Pt to report new, worsening, or return of symptoms.   Gastroesophageal reflux disease without esophagitis Well controlled with Pepcid. Continue below. Report any new or worsening symptoms.  -     famotidine (PEPCID) 20 MG tablet; Take 1 tablet (20 mg total) by mouth 2 (two) times daily.     Follow Up Instructions: Return in about 2 months (around 04/30/2019), or if symptoms worsen or fail to improve.    I discussed the assessment and treatment plan with the patient. The patient was provided an opportunity to ask questions and all were answered. The patient agreed with the plan and demonstrated an understanding of the instructions.   The patient was advised to call back or seek an in-person evaluation if the symptoms worsen or if the condition fails to improve as anticipated.  The above assessment and management plan was discussed with the patient.  The patient verbalized understanding of and has agreed to the management plan. Patient is aware to call the clinic if symptoms persist or worsen. Patient is aware when to return to the clinic for a follow-up visit. Patient educated on when it is appropriate to go to the emergency department.    I provided 15 minutes of non-face-to-face time during this encounter. The call started at 0935. The call ended at 0950. The other time was used for coordination of care.    Monia Pouch, FNP-C [facility] [address] [place], [place] [address] [phone]

## 2019-05-01 ENCOUNTER — Other Ambulatory Visit: Payer: Self-pay

## 2019-05-02 ENCOUNTER — Encounter: Payer: Self-pay | Admitting: Family Medicine

## 2019-05-02 ENCOUNTER — Ambulatory Visit (INDEPENDENT_AMBULATORY_CARE_PROVIDER_SITE_OTHER): Payer: Medicare HMO | Admitting: Family Medicine

## 2019-05-02 VITALS — BP 130/62 | HR 66 | Temp 96.8°F | Resp 18 | Ht <= 58 in | Wt 118.0 lb

## 2019-05-02 DIAGNOSIS — Z23 Encounter for immunization: Secondary | ICD-10-CM | POA: Diagnosis not present

## 2019-05-02 DIAGNOSIS — K219 Gastro-esophageal reflux disease without esophagitis: Secondary | ICD-10-CM | POA: Diagnosis not present

## 2019-05-02 DIAGNOSIS — J301 Allergic rhinitis due to pollen: Secondary | ICD-10-CM

## 2019-05-02 MED ORDER — FAMOTIDINE 20 MG PO TABS: 20.0000 mg | ORAL_TABLET | Freq: Two times a day (BID) | ORAL | 3 refills | Status: DC

## 2019-05-02 NOTE — Patient Instructions (Signed)
Allergies, Adult An allergy means that your body reacts to something that bothers it (allergen). It is not a normal reaction. This can happen from something that you:  Eat.  Breathe in.  Touch. You can have an allergy (be allergic) to:  Outdoor things, like: ? Pollen. ? Grass. ? Weeds.  Indoor things, like: ? Dust. ? Smoke. ? Pet dander.  Foods.  Medicines.  Things that bother your skin, like: ? Detergents. ? Chemicals. ? Latex.  Perfume.  Bugs. An allergy cannot spread from person to person (is not contagious). Follow these instructions at home:         Stay away from things that you know you are allergic to.  If you have allergies to things in the air, wash out your nose each day. Do it with one of these: ? A salt-water (saline) spray. ? A container (neti pot).  Take over-the-counter and prescription medicines only as told by your doctor.  Keep all follow-up visits as told by your doctor. This is important.  If you are at risk for a very bad allergy reaction (anaphylaxis), keep an auto-injector with you all the time. This is called an epinephrine injection. ? This is pre-measured medicine with a needle. You can put it into your skin by yourself. ? Right after you have a very bad allergy reaction, you or a person with you must give the medicine in less than a few minutes. This is an emergency.  If you have ever had a very bad allergy reaction, wear a medical alert bracelet or necklace. Your very bad allergy should be written on it. Contact a health care provider if:  Your symptoms do not get better with treatment. Get help right away if:  You have symptoms of a very bad allergy reaction. These include: ? A swollen mouth, tongue, or throat. ? Pain or tightness in your chest. ? Trouble breathing. ? Being short of breath. ? Dizziness. ? Fainting. ? Very bad pain in your belly (abdomen). ? Throwing up (vomiting). ? Watery poop (diarrhea). Summary   An allergy means that your body reacts to something that bothers it (allergen). It is not a normal reaction.  Stay away from things that make your body react.  Take over-the-counter and prescription medicines only as told by your doctor.  If you are at risk for a very bad allergy reaction, carry an auto-injector (epinephrine injection) all the time. Also, wear a medical alert bracelet or necklace so people know about your allergy. This information is not intended to replace advice given to you by your health care provider. Make sure you discuss any questions you have with your health care provider. Document Released: 11/27/2012 Document Revised: 11/21/2018 Document Reviewed: 11/15/2016 Elsevier Patient Education  2020 [employer]  

## 2019-05-02 NOTE — Progress Notes (Signed)
Subjective:  Patient ID: Rebecca Macdonald, female    DOB: 21-Aug-1945, 73 y.o.   MRN: [id number]  Patient Care Team: Baruch Gouty, FNP as PCP - General (Family Medicine) Gala Romney Cristopher Estimable, MD as Consulting Physician (Gastroenterology)   Chief Complaint:  Medical Management of Chronic Issues (inhaler and allergy follow up )   HPI: Rebecca Macdonald is a 73 y.o. female presenting on 05/02/2019 for Medical Management of Chronic Issues (inhaler and allergy follow up )   1. Seasonal allergic rhinitis due to pollen  Pt doing very well on current medication regimen. Denies continued need for Symbicort. States she only uses this when pollen counts are high causing increased cough and wheezing. States she may use 3-6 times per month. No other associated symptoms.    2. Gastroesophageal reflux disease without esophagitis  Compliant with medications - Yes Current medications - famotidine 20 mg BID Adverse side effects - No DEXA if on PPI - DEXA scan 06/2018 Cough - Yes, intermittently due to allergies Sore throat - No Voice change - No Hemoptysis - No Dysphagia or dyspepsia - No Water brash - No Red Flags (weight loss, hematochezia, Rebecca, weight loss, early satiety, fevers, odynophagia, or persistent vomiting) - No      Relevant past medical, surgical, family, and social history reviewed and updated as indicated.  Allergies and medications reviewed and updated. Date reviewed: Chart in Epic.   Past Medical History:  Diagnosis Date  . Asthma   . Cataract   . Hypertension     Past Surgical History:  Procedure Laterality Date  . CHOLECYSTECTOMY  2013  . COLONOSCOPY N/A 06/15/2016   Procedure: COLONOSCOPY;  Surgeon: Daneil Dolin, MD;  Location: [facility];  Service: Endoscopy;  Laterality: N/A;  1215  . POLYPECTOMY  06/15/2016   Procedure: POLYPECTOMY;  Surgeon: Daneil Dolin, MD;  Location: [facility];  Service: Endoscopy;;  colon    Social History   Socioeconomic History  .  Marital status: Widowed    Spouse name: Not on file  . Number of children: 1  . Years of education: Not on file  . Highest education level: 11th grade  Occupational History  . Occupation: Retired  Scientific laboratory technician  . Financial resource strain: Not hard at all  . Food insecurity    Worry: Never true    Inability: Never true  . Transportation needs    Medical: No    Non-medical: No  Tobacco Use  . Smoking status: Former Smoker    Quit date: 03/13/2009    Years since quitting: 10.1  . Smokeless tobacco: Never Used  Substance and Sexual Activity  . Alcohol use: No  . Drug use: No  . Sexual activity: Yes  Lifestyle  . Physical activity    Days per week: 0 days    Minutes per session: 0 min  . Stress: Not at all  Relationships  . Social connections    Talks on phone: More than three times a week    Gets together: More than three times a week    Attends religious service: Never    Active member of club or organization: No    Attends meetings of clubs or organizations: Never    Relationship status: Widowed  . Intimate partner violence    Fear of current or ex partner: No    Emotionally abused: No    Physically abused: No    Forced sexual activity: No  Other Topics Concern  .  Not on file  Social History Narrative  . Not on file    Outpatient Encounter Medications as of 05/02/2019  Medication Sig  . acetaminophen (TYLENOL) 500 MG tablet Take 500 mg by mouth every 6 (six) hours as needed for moderate pain or headache.  . alendronate (FOSAMAX) 70 MG tablet Take 1 tablet (70 mg total) by mouth every 7 (seven) days. Take with a full glass of water on an empty stomach.  . budesonide-formoterol (SYMBICORT) 80-4.5 MCG/ACT inhaler Inhale 2 puffs into the lungs 2 (two) times daily.  . Calcium Carb-Cholecalciferol (CALCIUM 600-D PO) Take 2 tablets by mouth daily.  . cholecalciferol (VITAMIN D3) 25 MCG (1000 UT) tablet Take 2,000 Units by mouth daily.  . famotidine (PEPCID) 20 MG tablet  Take 1 tablet (20 mg total) by mouth 2 (two) times daily.  . fluticasone (FLONASE) 50 MCG/ACT nasal spray Place 1 spray into both nostrils 2 (two) times daily as needed for allergies or rhinitis.  Marland Kitchen loratadine (CLARITIN) 10 MG tablet Take 1 tablet (10 mg total) by mouth daily.  . meclizine (ANTIVERT) 25 MG tablet Take 25 mg by mouth daily as needed for dizziness.  . pravastatin (PRAVACHOL) 40 MG tablet Take 1 tablet (40 mg total) by mouth daily.  . [DISCONTINUED] famotidine (PEPCID) 20 MG tablet Take 1 tablet (20 mg total) by mouth 2 (two) times daily.   No facility-administered encounter medications on file as of 05/02/2019.     No Known Allergies  Review of Systems  Constitutional: Negative for activity change, appetite change, chills, diaphoresis, fatigue, fever and unexpected weight change.  HENT: Positive for congestion, postnasal drip and rhinorrhea. Negative for sore throat, trouble swallowing and voice change.   Eyes: Negative.  Negative for photophobia and visual disturbance.  Respiratory: Positive for cough and wheezing. Negative for choking, chest tightness, shortness of breath and stridor.   Cardiovascular: Negative for chest pain, palpitations and leg swelling.  Gastrointestinal: Negative for abdominal pain, anal bleeding, blood in stool, constipation, diarrhea, nausea, rectal pain and vomiting.  Endocrine: Negative.   Genitourinary: Negative for dysuria, frequency and urgency.  Musculoskeletal: Negative for arthralgias and myalgias.  Skin: Negative.  Negative for color change and pallor.  Allergic/Immunologic: Negative.   Neurological: Negative for dizziness, tremors, syncope, facial asymmetry, speech difficulty, weakness, light-headedness, numbness and headaches.  Hematological: Negative.   Psychiatric/Behavioral: Negative for confusion, hallucinations, sleep disturbance and suicidal ideas.  All other systems reviewed and are negative.       Objective:  BP 130/62    Pulse 66   Temp (!) 96.8 F (36 C)   Resp 18   Ht '4\' 10"'$  (1.473 m)   Wt 118 lb (53.5 kg)   SpO2 95%   BMI 24.66 kg/m    Wt Readings from Last 3 Encounters:  05/02/19 118 lb (53.5 kg)  01/26/19 122 lb (55.3 kg)  01/16/19 122 lb (55.3 kg)    Physical Exam Vitals signs and nursing note reviewed.  Constitutional:      General: She is not in acute distress.    Appearance: Normal appearance. She is well-developed and well-groomed. She is not ill-appearing, toxic-appearing or diaphoretic.  HENT:     Head: Normocephalic and atraumatic.     Jaw: There is normal jaw occlusion.     Right Ear: Hearing normal.     Left Ear: Hearing normal.     Nose: Rhinorrhea present. Rhinorrhea is clear.     Right Turbinates: Enlarged and pale.  Left Turbinates: Enlarged and pale.     Mouth/Throat:     Lips: Pink.     Mouth: Mucous membranes are moist.     Pharynx: Oropharynx is clear. Uvula midline. No oropharyngeal exudate or posterior oropharyngeal erythema.     Tonsils: No tonsillar exudate.  Eyes:     General: Lids are normal.     Extraocular Movements: Extraocular movements intact.     Conjunctiva/sclera: Conjunctivae normal.     Pupils: Pupils are equal, round, and reactive to light.  Neck:     Musculoskeletal: Normal range of motion and neck supple.     Thyroid: No thyroid mass, thyromegaly or thyroid tenderness.     Vascular: No carotid bruit or JVD.     Trachea: Trachea and phonation normal.  Cardiovascular:     Rate and Rhythm: Normal rate and regular rhythm.     Chest Wall: PMI is not displaced.     Pulses: Normal pulses.     Heart sounds: Normal heart sounds. No murmur. No friction rub. No gallop.   Pulmonary:     Effort: Pulmonary effort is normal. No respiratory distress.     Breath sounds: Normal breath sounds. No wheezing or rhonchi.  Abdominal:     General: Bowel sounds are normal. There is no distension or abdominal bruit.     Palpations: Abdomen is soft. There is no  hepatomegaly or splenomegaly.     Tenderness: There is no abdominal tenderness. There is no right CVA tenderness or left CVA tenderness.     Hernia: No hernia is present.  Musculoskeletal: Normal range of motion.     Right lower leg: No edema.     Left lower leg: No edema.  Lymphadenopathy:     Cervical: No cervical adenopathy.  Skin:    General: Skin is warm and dry.     Capillary Refill: Capillary refill takes less than 2 seconds.     Coloration: Skin is not cyanotic, jaundiced or pale.     Findings: No rash.  Neurological:     General: No focal deficit present.     Mental Status: She is alert and oriented to person, place, and time.     Cranial Nerves: Cranial nerves are intact.     Sensory: Sensation is intact.     Motor: Motor function is intact.     Coordination: Coordination is intact.     Gait: Gait is intact.     Deep Tendon Reflexes: Reflexes are normal and symmetric.  Psychiatric:        Attention and Perception: Attention and perception normal.        Mood and Affect: Mood and affect normal.        Speech: Speech normal.        Behavior: Behavior normal. Behavior is cooperative.        Thought Content: Thought content normal.        Cognition and Memory: Cognition and memory normal.        Judgment: Judgment normal.     Results for orders placed or performed in visit on 01/23/19  CMP14+EGFR  Result Value Ref Range   Glucose 106 (H) 65 - 99 mg/dL   BUN 20 8 - 27 mg/dL   Creatinine, Ser 0.84 0.57 - 1.00 mg/dL   GFR calc non Af Amer 71 >59 mL/min/1.73   GFR calc Af Amer 81 >59 mL/min/1.73   BUN/Creatinine Ratio 24 12 - 28   Sodium 142 134 - 144  mmol/L   Potassium 3.8 3.5 - 5.2 mmol/L   Chloride 101 96 - 106 mmol/L   CO2 24 20 - 29 mmol/L   Calcium 8.9 8.7 - 10.3 mg/dL   Total Protein 6.5 6.0 - 8.5 g/dL   Albumin 4.1 3.8 - 4.8 g/dL   Globulin, Total 2.4 1.5 - 4.5 g/dL   Albumin/Globulin Ratio 1.7 1.2 - 2.2   Bilirubin Total 0.3 0.0 - 1.2 mg/dL   Alkaline  Phosphatase 51 39 - 117 IU/L   AST 27 0 - 40 IU/L   ALT 26 0 - 32 IU/L       Pertinent labs & imaging results that were available during my care of the patient were reviewed by me and considered in my medical decision making.  Assessment & Plan:  Golden was seen today for medical management of chronic issues.  Diagnoses and all orders for this visit:  Seasonal allergic rhinitis due to pollen Doing well on current medication regimen. States she only needs the Symbicort when pollen levels are high and she develops cough and wheezing from her allergies. States the does not use on a daily basis. Maybe 3-6 times per month.   Gastroesophageal reflux disease without esophagitis Diet discussed. Avoid fried, spicy, fatty, and acidic foods. Avoid caffeine, nicotine, and alcohol. Do not eat 2-3 hours before bedtime and stay upright for at least 1-2 hours after eating. Eat small frequent meals. Avoid NSAID's like motrin and aleve. Medications as prescribed. Report any new or worsening symptoms. Follow up as discussed or sooner if needed.   -     famotidine (PEPCID) 20 MG tablet; Take 1 tablet (20 mg total) by mouth 2 (two) times daily.  Need for immunization against influenza -     Flu Vaccine QUAD High Dose(Fluad)     Continue all other maintenance medications.  Follow up plan: Return in about 3 months (around 08/01/2019), or if symptoms worsen or fail to improve, for Lipids, GERD. Labs.  Continue healthy lifestyle choices, including diet (rich in fruits, vegetables, and lean proteins, and low in salt and simple carbohydrates) and exercise (at least 30 minutes of moderate physical activity daily).  Educational handout given for allergies  The above assessment and management plan was discussed with the patient. The patient verbalized understanding of and has agreed to the management plan. Patient is aware to call the clinic if they develop any new symptoms or if symptoms persist or worsen.  Patient is aware when to return to the clinic for a follow-up visit. Patient educated on when it is appropriate to go to the emergency department.   Monia Pouch, FNP-C [facility] [phone]

## 2019-06-05 ENCOUNTER — Encounter: Payer: Self-pay | Admitting: Internal Medicine

## 2019-06-19 ENCOUNTER — Telehealth: Payer: Self-pay | Admitting: Family Medicine

## 2019-06-19 NOTE — Telephone Encounter (Signed)
[email or web address]

## 2019-06-19 NOTE — Telephone Encounter (Signed)
In referral not it states patient refused appt.

## 2019-06-19 NOTE — Telephone Encounter (Signed)
Patients brother had past away at that time she is going to call them to see if she can set up appt. If not she will call us and we will do a new referral.

## 2019-06-19 NOTE — Telephone Encounter (Signed)
Ok. Not sure why she called to say she did not have it completed.

## 2019-06-19 NOTE — Telephone Encounter (Signed)
A referral was placed in June 2020 for this.

## 2019-07-09 ENCOUNTER — Ambulatory Visit (INDEPENDENT_AMBULATORY_CARE_PROVIDER_SITE_OTHER): Payer: Self-pay | Admitting: *Deleted

## 2019-07-09 ENCOUNTER — Other Ambulatory Visit: Payer: Self-pay

## 2019-07-09 DIAGNOSIS — Z86010 Personal history of colonic polyps: Secondary | ICD-10-CM

## 2019-07-09 MED ORDER — NA SULFATE-K SULFATE-MG SULF 17.5-3.13-1.6 GM/177ML PO SOLN: 1.0000 | Freq: Once | ORAL | 0 refills | Status: AC

## 2019-07-09 NOTE — Progress Notes (Signed)
Gastroenterology Pre-Procedure Review  Request Date: 07/09/2019 Requesting Physician: 3 year recall, Last TCS 06/15/2016 done by Dr. Gala Romney, tubular adenoma  PATIENT REVIEW QUESTIONS: The patient responded to the following health history questions as indicated:    1. Diabetes Melitis: no 2. Joint replacements in the past 12 months: no 3. Major health problems in the past 3 months: no 4. Has an artificial valve or MVP: no 5. Has a defibrillator: no 6. Has been advised in past to take antibiotics in advance of a procedure like teeth cleaning: no 7. Family history of colon cancer: no  8. Alcohol Use: no 9. Illicit drug Use: no 10. History of sleep apnea: no  11. History of coronary artery or other vascular stents placed within the last 12 months: no 12. History of any prior anesthesia complications: no 13. There is no height or weight on file to calculate BMI.ht: 4'10 wt: 120 lbs    MEDICATIONS & ALLERGIES:    Patient reports the following regarding taking any blood thinners:   Plavix? no Aspirin? no Coumadin? no Brilinta? no Xarelto? no Eliquis? no Pradaxa? no Savaysa? no Effient? no  Patient confirms/reports the following medications:  Current Outpatient Medications  Medication Sig Dispense Refill  . acetaminophen (TYLENOL) 500 MG tablet Take 500 mg by mouth as needed for moderate pain or headache.     . budesonide-formoterol (SYMBICORT) 80-4.5 MCG/ACT inhaler Inhale 2 puffs into the lungs 2 (two) times daily. 1 Inhaler 3  . Calcium Carb-Cholecalciferol (CALCIUM 600-D PO) Take 2 tablets by mouth daily.    . cholecalciferol (VITAMIN D3) 25 MCG (1000 UT) tablet Take 2,000 Units by mouth daily.    . famotidine (PEPCID) 20 MG tablet Take 1 tablet (20 mg total) by mouth 2 (two) times daily. 180 tablet 3  . loratadine (CLARITIN) 10 MG tablet Take 1 tablet (10 mg total) by mouth daily. 30 tablet 11  . meclizine (ANTIVERT) 25 MG tablet Take 25 mg by mouth as needed for dizziness.      . pravastatin (PRAVACHOL) 40 MG tablet Take 1 tablet (40 mg total) by mouth daily. 90 tablet 3   No current facility-administered medications for this visit.     Patient confirms/reports the following allergies:  No Known Allergies  No orders of the defined types were placed in this encounter.   AUTHORIZATION INFORMATION Primary Insurance: [employer],  ID #: [id number],  Group #: [id number] Pre-Cert / Auth required: No, not required  SCHEDULE INFORMATION: Procedure has been scheduled as follows:  Date: 09/26/2019, Time: 1:00 Location: [facility] with Dr. Gala Romney  This Gastroenterology Pre-Precedure Review Form is being routed to the following provider(s): Roseanne Kaufman, NP

## 2019-07-09 NOTE — Patient Instructions (Signed)
Rebecca Macdonald  1946/05/19 MRN: [id number]     Procedure Date: 09/26/2019 Time to register: 12:00 pm Place to register: [facility] Procedure Time: 1:00 pm Scheduled provider: Dr. Gala Romney    PREPARATION FOR COLONOSCOPY WITH SUPREP BOWEL PREP KIT  Note: Suprep Bowel Prep Kit is a split-dose (2day) regimen. Consumption of BOTH 6-ounce bottles is required for a complete prep.  Please notify us immediately if you are diabetic, take iron supplements, or if you are on Coumadin or any other blood thinners.                                                                                                                                                  2 DAYS BEFORE PROCEDURE:  DATE: 09/24/2019   DAY: Monday Begin clear liquid diet AFTER your lunch meal. NO SOLID FOODS after this point.  1 DAY BEFORE PROCEDURE:  DATE: 09/25/2019  DAY: Tuesday Continue clear liquids the entire day - NO SOLID FOOD.    At 6:00pm: Complete steps 1 through 4 below, using ONE (1) 6-ounce bottle, before going to bed. Step 1:  Pour ONE (1) 6-ounce bottle of SUPREP liquid into the mixing container.  Step 2:  Add cool drinking water to the 16 ounce line on the container and mix.  Note: Dilute the solution concentrate as directed prior to use. Step 3:  DRINK ALL the liquid in the container. Step 4:  You MUST drink an additional two (2) or more 16 ounce containers of water over the next one (1) hour.   Continue clear liquids.  DAY OF PROCEDURE:   DATE: 09/26/2019   DAY: Wednesday If you take medications for your heart, blood pressure, or breathing, you may take these medications.   5 hours before your procedure at :  8:00 am Step 1:  Pour ONE (1) 6-ounce bottle of SUPREP liquid into the mixing container.  Step 2:  Add cool drinking water to the 16 ounce line on the container and mix.  Note: Dilute the solution concentrate as directed prior to use. Step 3:  DRINK ALL the liquid in the container. Step 4:  You MUST  drink an additional two (2) or more 16 ounce containers of water over the next one (1) hour. You MUST complete the final glass of water at least 3 hours before your colonoscopy. Nothing by mouth past 10:00 am  You may take your morning medications with sip of water unless we have instructed otherwise.    Please see below for Dietary Information.  CLEAR LIQUIDS INCLUDE:  Water Jello (NOT red in color)   Ice Popsicles (NOT red in color)   Tea (sugar ok, no milk/cream) Powdered fruit flavored drinks  Coffee (sugar ok, no milk/cream) Gatorade/ Lemonade/ Kool-Aid  (NOT red in color)   Juice: apple, white grape, white cranberry Soft drinks  Clear bullion, consomme, broth (fat free beef/chicken/vegetable)  Carbonated beverages (any kind)  Strained chicken noodle soup Hard Candy   Remember: Clear liquids are liquids that will allow you to see your fingers on the other side of a clear glass. Be sure liquids are NOT red in color, and not cloudy, but CLEAR.  DO NOT EAT OR DRINK ANY OF THE FOLLOWING:  Dairy products of any kind   Cranberry juice Tomato juice / V8 juice   Grapefruit juice Orange juice     Red grape juice  Do not eat any solid foods, including such foods as: cereal, oatmeal, yogurt, fruits, vegetables, creamed soups, eggs, bread, crackers, pureed foods in a blender, etc.   HELPFUL HINTS FOR DRINKING PREP SOLUTION:   Make sure prep is extremely cold. Mix and refrigerate the the morning of the prep. You may also put in the freezer.   You may try mixing some Crystal Light or Country Time Lemonade if you prefer. Mix in small amounts; add more if necessary.  Try drinking through a straw  Rinse mouth with water or a mouthwash between glasses, to remove after-taste.  Try sipping on a cold beverage /ice/ popsicles between glasses of prep.  Place a piece of sugar-free hard candy in mouth between glasses.  If you become nauseated, try consuming smaller amounts, or stretch out the  time between glasses. Stop for 30-60 minutes, then slowly start back drinking.     OTHER INSTRUCTIONS  You will need a responsible adult at least 71 years of age to accompany you and drive you home. This person must remain in the waiting room during your procedure. The hospital will cancel your procedure if you do not have a responsible adult with you.   1. Wear loose fitting clothing that is easily removed. 2. Leave jewelry and other valuables at home.  3. Remove all body piercing jewelry and leave at home. 4. Total time from sign-in until discharge is approximately 2-3 hours. 5. You should go home directly after your procedure and rest. You can resume normal activities the day after your procedure. 6. The day of your procedure you should not:  Drive  Make legal decisions  Operate machinery  Drink alcohol  Return to work   You may call the office (Dept: [phone]) before 5:00pm, or page the doctor on call [phone]) after 5:00pm, for further instructions, if necessary.   Insurance Information YOU WILL NEED TO CHECK WITH YOUR INSURANCE COMPANY FOR THE BENEFITS OF COVERAGE YOU HAVE FOR THIS PROCEDURE.  UNFORTUNATELY, NOT ALL INSURANCE COMPANIES HAVE BENEFITS TO COVER ALL OR PART OF THESE TYPES OF PROCEDURES.  IT IS YOUR RESPONSIBILITY TO CHECK YOUR BENEFITS, HOWEVER, WE WILL BE GLAD TO ASSIST YOU WITH ANY CODES YOUR INSURANCE COMPANY MAY NEED.    PLEASE NOTE THAT MOST INSURANCE COMPANIES WILL NOT COVER A SCREENING COLONOSCOPY FOR PEOPLE UNDER THE AGE OF 50  IF YOU HAVE BC[employer] INSURANCE, YOU MAY HAVE BENEFITS FOR A SCREENING COLONOSCOPY BUT IF POLYPS ARE FOUND THE DIAGNOSIS WILL CHANGE AND THEN YOU MAY HAVE A DEDUCTIBLE THAT WILL NEED TO BE MET. SO PLEASE MAKE SURE YOU CHECK YOUR BENEFITS FOR A SCREENING COLONOSCOPY AS WELL AS A DIAGNOSTIC COLONOSCOPY.

## 2019-07-16 NOTE — Progress Notes (Signed)
Appropriate.

## 2019-07-16 NOTE — Addendum Note (Signed)
Addended by: Metro Kung on: 07/16/2019 12:04 PM   Modules accepted: Orders, SmartSet

## 2019-08-01 ENCOUNTER — Ambulatory Visit: Payer: Medicare HMO | Admitting: Family Medicine

## 2019-08-06 ENCOUNTER — Telehealth: Payer: Self-pay | Admitting: Family Medicine

## 2019-08-07 ENCOUNTER — Ambulatory Visit (INDEPENDENT_AMBULATORY_CARE_PROVIDER_SITE_OTHER): Payer: Medicare HMO | Admitting: Family Medicine

## 2019-08-07 ENCOUNTER — Other Ambulatory Visit: Payer: Self-pay

## 2019-08-07 ENCOUNTER — Encounter: Payer: Self-pay | Admitting: Family Medicine

## 2019-08-07 VITALS — BP 122/63 | HR 82 | Temp 99.0°F | Resp 20 | Ht <= 58 in | Wt 120.0 lb

## 2019-08-07 DIAGNOSIS — E782 Mixed hyperlipidemia: Secondary | ICD-10-CM | POA: Diagnosis not present

## 2019-08-07 DIAGNOSIS — M810 Age-related osteoporosis without current pathological fracture: Secondary | ICD-10-CM | POA: Diagnosis not present

## 2019-08-07 DIAGNOSIS — K219 Gastro-esophageal reflux disease without esophagitis: Secondary | ICD-10-CM | POA: Diagnosis not present

## 2019-08-07 DIAGNOSIS — Z6825 Body mass index (BMI) 25.0-25.9, adult: Secondary | ICD-10-CM | POA: Diagnosis not present

## 2019-08-07 DIAGNOSIS — E559 Vitamin D deficiency, unspecified: Secondary | ICD-10-CM | POA: Diagnosis not present

## 2019-08-07 NOTE — Progress Notes (Signed)
Subjective:  Patient ID: Rebecca Macdonald, female    DOB: [birth year], 73 y.o.   MRN: [id number]  Patient Care Team: Baruch Gouty, FNP as PCP - General (Family Medicine) Gala Romney Cristopher Estimable, MD as Consulting Physician (Gastroenterology)   Chief Complaint:  Medical Management of Chronic Issues (3- 6 mo) and Hyperlipidemia   HPI: Rebecca Macdonald is a 73 y.o. female presenting on 08/07/2019 for Medical Management of Chronic Issues (3- 6 mo) and Hyperlipidemia   1. Vitamin D deficiency Pt is taking oral repletion therapy. Denies bone pain and tenderness, muscle weakness, fracture, and difficulty walking. Lab Results  Component Value Date   VD25OH 46.9 01/16/2019   VD25OH 15.5 (L) 07/04/2018   Lab Results  Component Value Date   CALCIUM 8.9 01/23/2019      2. Gastroesophageal reflux disease without esophagitis Compliant with medications - Yes Current medications - famotidine Adverse side effects - No DEXA if on PPI - 07/04/2018 Cough - No Sore throat - No Voice change - No Hemoptysis - No Dysphagia or dyspepsia - No Water brash - No Red Flags (weight loss, hematochezia, melena, weight loss, early satiety, fevers, odynophagia, or persistent vomiting) - No   3. Mixed hyperlipidemia Compliant with medications - Yes Current medications - pravastatin Side effects from medications - No Diet - generally healthy Exercise - active daily  Lab Results  Component Value Date   CHOL 143 01/16/2019   HDL 43 01/16/2019   LDLCALC 72 01/16/2019   TRIG 139 01/16/2019   CHOLHDL 3.3 01/16/2019     Family and personal medical history reviewed. Smoking and ETOH history reviewed.    4. Age-related osteoporosis without current pathological fracture Takes calcium repletion therapy with Vit D repletion therapy daily. Denies OJN symptoms.   5. BMI 25.0-25.9,adult Does try to watch diet and stay active. Has lost 2 pounds in last 4 weeks per pt report.      Relevant past medical, surgical,  family, and social history reviewed and updated as indicated.  Allergies and medications reviewed and updated. Date reviewed: Chart in Epic.   Past Medical History:  Diagnosis Date  . Asthma   . Cataract   . Hypertension     Past Surgical History:  Procedure Laterality Date  . CHOLECYSTECTOMY  2013  . COLONOSCOPY N/A 06/15/2016   Procedure: COLONOSCOPY;  Surgeon: Daneil Dolin, MD;  Location: [facility];  Service: Endoscopy;  Laterality: N/A;  1215  . POLYPECTOMY  06/15/2016   Procedure: POLYPECTOMY;  Surgeon: Daneil Dolin, MD;  Location: [facility];  Service: Endoscopy;;  colon    Social History   Socioeconomic History  . Marital status: Widowed    Spouse name: Not on file  . Number of children: 1  . Years of education: Not on file  . Highest education level: 11th grade  Occupational History  . Occupation: Retired  Tobacco Use  . Smoking status: Former Smoker    Quit date: 03/13/2009    Years since quitting: 10.4  . Smokeless tobacco: Never Used  Substance and Sexual Activity  . Alcohol use: No  . Drug use: No  . Sexual activity: Yes  Other Topics Concern  . Not on file  Social History Narrative  . Not on file   Social Determinants of Health   Financial Resource Strain: Low Risk   . Difficulty of Paying Living Expenses: Not hard at all  Food Insecurity: No Food Insecurity  . Worried About Estate manager/land agent  of Food in the Last Year: Never true  . Ran Out of Food in the Last Year: Never true  Transportation Needs: No Transportation Needs  . Lack of Transportation (Medical): No  . Lack of Transportation (Non-Medical): No  Physical Activity: Inactive  . Days of Exercise per Week: 0 days  . Minutes of Exercise per Session: 0 min  Stress: No Stress Concern Present  . Feeling of Stress : Not at all  Social Connections: Moderately Isolated  . Frequency of Communication with Friends and Family: More than three times a week  . Frequency of Social Gatherings with  Friends and Family: More than three times a week  . Attends Religious Services: Never  . Active Member of Clubs or Organizations: No  . Attends Archivist Meetings: Never  . Marital Status: Widowed  Intimate Partner Violence: Not At Risk  . Fear of Current or Ex-Partner: No  . Emotionally Abused: No  . Physically Abused: No  . Sexually Abused: No    Outpatient Encounter Medications as of 08/07/2019  Medication Sig  . acetaminophen (TYLENOL) 500 MG tablet Take 500 mg by mouth as needed for moderate pain or headache.   . alendronate (FOSAMAX) 70 MG tablet Take 70 mg by mouth once a week. Take with a full glass of water on an empty stomach.  . budesonide-formoterol (SYMBICORT) 80-4.5 MCG/ACT inhaler Inhale 2 puffs into the lungs 2 (two) times daily.  . Calcium Carb-Cholecalciferol (CALCIUM 600-D PO) Take 2 tablets by mouth daily.  . cholecalciferol (VITAMIN D3) 25 MCG (1000 UT) tablet Take 2,000 Units by mouth daily.  . famotidine (PEPCID) 20 MG tablet Take 1 tablet (20 mg total) by mouth 2 (two) times daily.  Marland Kitchen loratadine (CLARITIN) 10 MG tablet Take 1 tablet (10 mg total) by mouth daily.  . pravastatin (PRAVACHOL) 40 MG tablet Take 1 tablet (40 mg total) by mouth daily.  . [DISCONTINUED] meclizine (ANTIVERT) 25 MG tablet Take 25 mg by mouth as needed for dizziness.    No facility-administered encounter medications on file as of 08/07/2019.    No Known Allergies  Review of Systems  Constitutional: Negative for activity change, appetite change, chills, fatigue, fever and unexpected weight change.  HENT: Negative.   Eyes: Negative.  Negative for photophobia and visual disturbance.  Respiratory: Negative for cough, chest tightness, shortness of breath and wheezing.   Cardiovascular: Negative for chest pain, palpitations and leg swelling.  Gastrointestinal: Negative for abdominal pain, anal bleeding, blood in stool, constipation, diarrhea, nausea and vomiting.  Endocrine:  Negative.  Negative for polydipsia, polyphagia and polyuria.  Genitourinary: Negative for decreased urine volume, difficulty urinating, dysuria, frequency and urgency.  Musculoskeletal: Negative for arthralgias, back pain, gait problem, joint swelling, myalgias, neck pain and neck stiffness.  Skin: Negative.  Negative for color change and rash.  Allergic/Immunologic: Negative.   Neurological: Negative for dizziness, tremors, seizures, syncope, facial asymmetry, speech difficulty, weakness, light-headedness, numbness and headaches.  Hematological: Negative.   Psychiatric/Behavioral: Negative for confusion, hallucinations, sleep disturbance and suicidal ideas.  All other systems reviewed and are negative.       Objective:  BP 122/63   Pulse 82   Temp 99 F (37.2 C)   Resp 20   Ht 4' 10" (1.473 m)   Wt 120 lb (54.4 kg)   SpO2 97%   BMI 25.08 kg/m    Wt Readings from Last 3 Encounters:  08/07/19 120 lb (54.4 kg)  05/02/19 118 lb (53.5 kg)  01/26/19  122 lb (55.3 kg)    Physical Exam Vitals and nursing note reviewed.  Constitutional:      General: She is not in acute distress.    Appearance: Normal appearance. She is well-developed and well-groomed. She is not ill-appearing, toxic-appearing or diaphoretic.  HENT:     Head: Normocephalic and atraumatic.     Jaw: There is normal jaw occlusion.     Right Ear: Hearing normal.     Left Ear: Hearing normal.     Nose: Nose normal.     Mouth/Throat:     Lips: Pink.     Mouth: Mucous membranes are moist.     Pharynx: Oropharynx is clear. Uvula midline.  Eyes:     General: Lids are normal.     Extraocular Movements: Extraocular movements intact.     Conjunctiva/sclera: Conjunctivae normal.     Pupils: Pupils are equal, round, and reactive to light.  Neck:     Thyroid: No thyroid mass, thyromegaly or thyroid tenderness.     Vascular: No carotid bruit or JVD.     Trachea: Trachea and phonation normal.  Cardiovascular:     Rate  and Rhythm: Normal rate and regular rhythm.     Chest Wall: PMI is not displaced.     Pulses: Normal pulses.     Heart sounds: Normal heart sounds. No murmur. No friction rub. No gallop.   Pulmonary:     Effort: Pulmonary effort is normal. No respiratory distress.     Breath sounds: Normal breath sounds. No wheezing.  Abdominal:     General: Bowel sounds are normal. There is no distension or abdominal bruit.     Palpations: Abdomen is soft. There is no hepatomegaly or splenomegaly.     Tenderness: There is no abdominal tenderness. There is no right CVA tenderness or left CVA tenderness.     Hernia: No hernia is present.  Musculoskeletal:        General: Normal range of motion.     Cervical back: Normal range of motion and neck supple.     Right lower leg: No edema.     Left lower leg: No edema.  Lymphadenopathy:     Cervical: No cervical adenopathy.  Skin:    General: Skin is warm and dry.     Capillary Refill: Capillary refill takes less than 2 seconds.     Coloration: Skin is not cyanotic, jaundiced or pale.     Findings: No rash.  Neurological:     General: No focal deficit present.     Mental Status: She is alert and oriented to person, place, and time.     Cranial Nerves: Cranial nerves are intact. No cranial nerve deficit.     Sensory: Sensation is intact. No sensory deficit.     Motor: Motor function is intact. No weakness.     Coordination: Coordination is intact. Coordination normal.     Gait: Gait is intact. Gait normal.     Deep Tendon Reflexes: Reflexes are normal and symmetric. Reflexes normal.  Psychiatric:        Attention and Perception: Attention and perception normal.        Mood and Affect: Mood and affect normal.        Speech: Speech normal.        Behavior: Behavior normal. Behavior is cooperative.        Thought Content: Thought content normal.        Cognition and Memory: Cognition and memory normal.  Judgment: Judgment normal.     Results for  orders placed or performed in visit on 01/23/19  CMP14+EGFR  Result Value Ref Range   Glucose 106 (H) 65 - 99 mg/dL   BUN 20 8 - 27 mg/dL   Creatinine, Ser 0.84 0.57 - 1.00 mg/dL   GFR calc non Af Amer 71 >59 mL/min/1.73   GFR calc Af Amer 81 >59 mL/min/1.73   BUN/Creatinine Ratio 24 12 - 28   Sodium 142 134 - 144 mmol/L   Potassium 3.8 3.5 - 5.2 mmol/L   Chloride 101 96 - 106 mmol/L   CO2 24 20 - 29 mmol/L   Calcium 8.9 8.7 - 10.3 mg/dL   Total Protein 6.5 6.0 - 8.5 g/dL   Albumin 4.1 3.8 - 4.8 g/dL   Globulin, Total 2.4 1.5 - 4.5 g/dL   Albumin/Globulin Ratio 1.7 1.2 - 2.2   Bilirubin Total 0.3 0.0 - 1.2 mg/dL   Alkaline Phosphatase 51 39 - 117 IU/L   AST 27 0 - 40 IU/L   ALT 26 0 - 32 IU/L       Pertinent labs & imaging results that were available during my care of the patient were reviewed by me and considered in my medical decision making.  Assessment & Plan:  Enedina was seen today for medical management of chronic issues and hyperlipidemia.  Diagnoses and all orders for this visit:  Vitamin D deficiency Labs pending. Continue repletion therapy. If indicated, will change repletion dosage. Eat foods rich in Vit D including milk, orange juice, yogurt with vitamin D added, salmon or mackerel, canned tuna fish, cereals with vitamin D added, and cod liver oil. Get out in the sun but make sure to wear at least SPF 30 sunscreen.  -     Vitamin D 25 hydroxy  Gastroesophageal reflux disease without esophagitis No red flags present. Diet discussed. Avoid fried, spicy, fatty, greasy, and acidic foods. Avoid caffeine, nicotine, and alcohol. Do not eat 2-3 hours before bedtime and stay upright for at least 1-2 hours after eating. Eat small frequent meals. Avoid NSAID's like motrin and aleve. Medications as prescribed. Report any new or worsening symptoms. Follow up as discussed or sooner if needed.   -     CBC with Differential/Platelet  Mixed hyperlipidemia Diet encouraged - increase  intake of fresh fruits and vegetables, increase intake of lean proteins. Bake, broil, or grill foods. Avoid fried, greasy, and fatty foods. Avoid fast foods. Increase intake of fiber-rich whole grains. Exercise encouraged - at least 150 minutes per week and advance as tolerated.  Goal BMI < 25. Continue medications as prescribed. Follow up in 3-6 months as discussed.  -     Lipid panel  Age-related osteoporosis without current pathological fracture Doing well on Fosamax and calcium repletion. Will continue. DEXA due in 2021. -     CMP14+EGFR  BMI 25.0-25.9,adult Diet and exercise to maintain healthy weight discussed. Labs pending.  -     CBC with Differential/Platelet -     CMP14+EGFR -     Lipid panel -     Thyroid Panel With TSH     Continue all other maintenance medications.  Follow up plan: Return in about 6 months (around 02/05/2020), or if symptoms worsen or fail to improve.  Continue healthy lifestyle choices, including diet (rich in fruits, vegetables, and lean proteins, and low in salt and simple carbohydrates) and exercise (at least 30 minutes of moderate physical activity daily).  Educational handout  given for osteoporosis  The above assessment and management plan was discussed with the patient. The patient verbalized understanding of and has agreed to the management plan. Patient is aware to call the clinic if they develop any new symptoms or if symptoms persist or worsen. Patient is aware when to return to the clinic for a follow-up visit. Patient educated on when it is appropriate to go to the emergency department.   Monia Pouch, FNP-C [facility] [phone]

## 2019-08-07 NOTE — Patient Instructions (Signed)
Osteoporosis Your recent bone density scan demonstrated that you have osteoporosis.  Today, I provided you a handout reviewing calcium and vitamin D and ways to incorporate this in your diet.  You need to take a calcium and vitamin D supplement daily. I would also like to start you on Prolia to prevent further loss of bone density. This is an injection that is given every 6 months at the office. Please let us know if this is something you would like to start. Strength training is just as important in maintaining bone health.  A copy of home exercises has been provided to you. You need to repeat your bone density in 2 years.    Osteoporosis is the thinning and loss of density in the bones. Osteoporosis makes the bones more brittle, fragile, and likely to break (fracture). Over time, osteoporosis can cause the bones to become so weak that they fracture after a simple fall. The bones most likely to fracture are the bones in the hip, wrist, and spine. What are the causes? The exact cause is not known. What increases the risk? Anyone can develop osteoporosis. You may be at greater risk if you have a family history of the condition or have poor nutrition. You may also have a higher risk if you are:  Female.  33 years old or older.  A smoker.  Not physically active.  White or Asian.  Slender.  What are the signs or symptoms? A fracture might be the first sign of the disease, especially if it results from a fall or injury that would not usually cause a bone to break. Other signs and symptoms include:  Low back and neck pain.  Stooped posture.  Height loss.  How is this diagnosed? To make a diagnosis, your health care provider may:  Take a medical history.  Perform a physical exam.  Order tests, such as: ? A bone mineral density test. ? A dual-energy X-ray absorptiometry test.  How is this treated? The goal of osteoporosis treatment is to strengthen your bones to reduce your risk  of a fracture. Treatment may involve:  Making lifestyle changes, such as: ? Eating a diet rich in calcium. ? Doing weight-bearing and muscle-strengthening exercises. ? Stopping tobacco use. ? Limiting alcohol intake.  Taking medicine to slow the process of bone loss or to increase bone density.  Monitoring your levels of calcium and vitamin D.  Follow these instructions at home:  Include calcium and vitamin D in your diet. Calcium is important for bone health, and vitamin D helps the body absorb calcium. Os-Cal is a great over the counter supplement to take daily. It has calcium and Vitamin D.   Perform weight-bearing and muscle-strengthening exercises as directed by your health care provider. Exercises listed below.   Do not use any tobacco products, including cigarettes, chewing tobacco, and electronic cigarettes. If you need help quitting, ask your health care provider.  Limit your alcohol intake.  Take medicines only as directed by your health care provider.  Keep all follow-up visits as directed by your health care provider. This is important.  Take precautions at home to lower your risk of falling, such as: ? Keeping rooms well lit and clutter free. ? Installing safety rails on stairs. ? Using rubber mats in the bathroom and other areas that are often wet or slippery.  Get help right away if: You fall or injure yourself. This information is not intended to replace advice given to you by your health  care provider. Make sure you discuss any questions you have with your health care provider. Document Released: 05/12/2005 Document Revised: 01/05/2016 Document Reviewed: 01/10/2014 Elsevier Interactive Patient Education  2018 [employer].  Exercise for [employer]  Exercise is important to build and maintain strong bones / bone density.  There are 2 types of exercises that are important to building and maintaining strong bones:  Weight- bearing and muscle-stregthening.   Weight-bearing Exercises  These exercises include activities that make you move against gravity while staying upright. Weight-bearing exercises can be high-impact or low-impact.  High-impact weight-bearing exercises help build bones and keep them strong. If you have broken a bone due to osteoporosis or are at risk of breaking a bone, you may need to avoid high-impact exercises. If you're not sure, you should check with your healthcare provider.  Examples of high-impact weight-bearing exercises are: . Dancing  . Doing high-impact aerobics  . Hiking  . Jogging/running  . Jumping Rope  . Stair climbing  . Tennis  Low-impact weight-bearing exercises can also help keep bones strong and are a safe alternative if you cannot do high-impact exercises.   Examples of low-impact weight-bearing exercises are: . Using elliptical training machines  . Doing low-impact aerobics  . Using stair-step machines  . Fast walking on a treadmill or outside   Muscle-Strengthening Exercises These exercises include activities where you move your body, a weight or some other resistance against gravity. They are also known as resistance exercises and include: . Lifting weights  . Using elastic exercise bands  . Using weight machines  . Lifting your own body weight  . Functional movements, such as standing and rising up on your toes  Yoga and Pilates can also improve strength, balance and flexibility. However, certain positions may not be safe for people with osteoporosis or those at increased risk of broken bones. For example, exercises that have you bend forward may increase the chance of breaking a bone in the spine.   Non-Impact Exercises There are other types of exercises that can help prevent falls.  Non-impact exercises can help you to improve balance, posture and how well you move in everyday activities. Some of these exercises include: . Balance exercises that strengthen your legs and test your  balance, such as Tai Chi, can decrease your risk of falls.  . Posture exercises that improve your posture and reduce rounded or "sloping" shoulders can help you decrease the chance of breaking a bone, especially in the spine.  . Functional exercises that improve how well you move can help you with everyday activities and decrease your chance of falling and breaking a bone. For example, if you have trouble getting up from a chair or climbing stairs, you should do these activities as exercises.   A physical therapist can teach you balance, posture and functional exercises. He/she can also help you learn which exercises are safe and appropriate for you.  [facility] has a physical therapy office in [place] in front of our office and referrals can be made for assessments and treatment as needed and strength and balance training.  If you would like to have an assessment with [facility] and our physical therapy team please let a nurse or provider know.  For more information go to the [facility] website: [email or web address]   . Click the striped box in the right upper corner.   . Then click through patient info in the left lower hand corner to out more about osteoporosis and osteopenia  and how you can prevent fractures.      Calcium & Vitamin D: The Facts  Why is calcium and vitamin D consumption important? Calcium: . Most Americans do not consume adequate amounts of calcium! Calcium is required for proper muscle function, nerve communication, bone support, and many other functions in the body.  . The body uses bones as a source of calcium. Bones 'remodel' themselves continuously - the body constantly breaks bone down to release calcium and rebuilds bones by replacing calcium in the bone later.  . As we get older, the rate of bone breakdown occurs faster than bone rebuilding which could lead to osteopenia, osteoporosis, and possible fractures.   Vitamin  D: . People naturally make vitamin D in the body when sunlight hits the skin and triggers a process that leads to vitamin D production. This natural vitamin D production requires about 10-15 minutes of sun exposure on the hands, arms, and face at least 2-3 times per week. However, due to decreased sun exposure and the use of sunscreen, most people will need to get additional vitamin D from foods or supplements. Your doctor can measure your body's vitamin D level through a simple blood test to determine your daily vitamin D needs.  . Vitamin D is used to help the body absorb calcium, maintain bone health, help the immune system, and reduce inflammation. It also plays a role in muscle performance, balance and risk of falling.  . Vitamin D deficiency can lead to osteomalacia or softening of the bones, bone pain, and muscle weakness.   The recommended daily allowance of Calcium and Vitamin D varies for different age groups. Age group Calcium (mg) Vitamin D (IU)  Females and Males: Age 19-50 1000 mg 600 IU  Females: Age 51- 70 1200 mg 600 IU  Males: Age 51-70 1000 mg 600 IU  Females and Males: Age 71+ 1200 mg 800 IU  Pregnant/lactating Females age 19-50 1000 mg 600 IU   How much Calcium do you get in your diet? Calcium Intake # of servings per day  Total calcium (mg)  Skim milk, 2% milk (1 cup) _________ x 300 mg   Yogurt (1 small container) _________ x 200 mg   Cheese (1oz) _________ x 200 mg   Cottage Cheese (1 cup)             ________ x 150 mg   Almond milk (1 cup) _________ x 450 mg   Fortified Orange Juice (1 cup) _________ x 300 mg   Broccoli or spinach ( 1 cup) _________ x 100 mg   Salmon (3 oz) _________ x 150 mg    Almonds (1/4 cup) _______ x 90 mg      How do we get Calcium and Vitamin D in our diet? Calcium: . Obtaining calcium from the diet is the most preferred way to reach the recommended daily goal. If this goal is not reached through diet, calcium supplements are available.   . Calcium is found in many foods including: dairy products, dark leafy vegetables (like broccoli, kale, and spinach), fish, and fortified products like juices and cereals.  . The food label will have a %DV (percent daily value) listed showing the amount of calcium per serving. To determine the total mg per serving, simply replace the % with zero (0).  For example, Almond Breeze almond milk contains 45% DV of calcium or 450mg per 1 cup.  . You can increase the amount of calcium in your diet by using   more calcium products in your daily meals. Use yogurt and fruit to make smoothies or use yogurt to top baked potatoes or make whipped potatoes. Sprinkle low fat cheese onto salads or into egg white omelets. You can even add non-fat dry milk powder (360m calcium per 1/3 cup) to hot cereals, meat loaf, soups, or potatoes.  . Calcium supplements come in many forms including tablets, chewables, and gummies. Be sure to read the label to determine the correct number of tablets per serving and whether or not to take the supplement with food.  . Calcium carbonate products (Oscal, Caltrate, and Viactiv) are generally better absorbed when taken with food while calcium citrate products like Citracal can be taken with or without food.  . The body can only absorb about 600 mg of calcium at one time. It is recommended to take calcium supplements in small amounts several times per day.  However, taking it all at once is better than not taking it at all. . Increasing your intake of calcium is essential for bone health, but may also lead to some side effects like constipation, increased gas, bloating or abdominal cramping. To help reduce these side effects, start with 1 tablet per day and slowly increase your intake of the supplement to the recommended doses. It is also recommended that you drink plenty of water each day. Vitamin D: . Very few foods naturally contain vitamin D. However, it is found in saltwater fish (like tuna, salmon and mackerel), beef  liver, egg yolks, cheese and vitamin D fortified foods (like yogurt, cereals, orange juice and milk) . The amount of vitamin D in each food or product is listed as %DV on the product label. To determine the total amount of vitamin D per serving, drop the % sign and multiply the number by 4. For example, 1 cup of Almond Breeze almond milk contains 25% DV vitamin D or 100 IU per serving (25 x 4 =100). . Vitamin D is also found in multivitamins and supplements and may be listed as ergocalciferol (vitamin D2) or cholecalciferol (vitamin D3). Each of these forms of vitamin D are equivalent and the daily recommended intake will vary based on your age and the vitamin D levels in your body. Follow your doctor's recommendation for vitamin D intake.

## 2019-08-08 LAB — CMP14+EGFR
ALT: 30 IU/L (ref 0–32)
AST: 30 IU/L (ref 0–40)
Albumin/Globulin Ratio: 1.8 (ref 1.2–2.2)
Albumin: 4.4 g/dL (ref 3.7–4.7)
Alkaline Phosphatase: 62 IU/L (ref 39–117)
BUN/Creatinine Ratio: 17 (ref 12–28)
BUN: 10 mg/dL (ref 8–27)
Bilirubin Total: 0.6 mg/dL (ref 0.0–1.2)
CO2: 27 mmol/L (ref 20–29)
Calcium: 9.1 mg/dL (ref 8.7–10.3)
Chloride: 101 mmol/L (ref 96–106)
Creatinine, Ser: 0.58 mg/dL (ref 0.57–1.00)
GFR calc Af Amer: 107 mL/min/{1.73_m2} (ref >59)
GFR calc non Af Amer: 93 mL/min/{1.73_m2} (ref >59)
Globulin, Total: 2.4 g/dL (ref 1.5–4.5)
Glucose: 110 mg/dL — ABNORMAL HIGH (ref 65–99)
Potassium: 3.9 mmol/L (ref 3.5–5.2)
Sodium: 141 mmol/L (ref 134–144)
Total Protein: 6.8 g/dL (ref 6.0–8.5)

## 2019-08-08 LAB — CBC WITH DIFFERENTIAL/PLATELET
Basophils Absolute: 0.1 10*3/uL (ref 0.0–0.2)
Basos: 1 %
EOS (ABSOLUTE): 0.1 10*3/uL (ref 0.0–0.4)
Eos: 1 %
Hematocrit: 44.2 % (ref 34.0–46.6)
Hemoglobin: 14.6 g/dL (ref 11.1–15.9)
Immature Grans (Abs): 0 10*3/uL (ref 0.0–0.1)
Immature Granulocytes: 0 %
Lymphocytes Absolute: 2.3 10*3/uL (ref 0.7–3.1)
Lymphs: 22 %
MCH: 30.4 pg (ref 26.6–33.0)
MCHC: 33 g/dL (ref 31.5–35.7)
MCV: 92 fL (ref 79–97)
Monocytes Absolute: 0.7 10*3/uL (ref 0.1–0.9)
Monocytes: 7 %
Neutrophils Absolute: 7.2 10*3/uL — ABNORMAL HIGH (ref 1.4–7.0)
Neutrophils: 69 %
Platelets: 220 10*3/uL (ref 150–450)
RBC: 4.81 x10E6/uL (ref 3.77–5.28)
RDW: 12.3 % (ref 11.7–15.4)
WBC: 10.5 10*3/uL (ref 3.4–10.8)

## 2019-08-08 LAB — LIPID PANEL
Chol/HDL Ratio: 3.4 ratio (ref 0.0–4.4)
Cholesterol, Total: 141 mg/dL (ref 100–199)
HDL: 41 mg/dL (ref >39)
LDL Chol Calc (NIH): 77 mg/dL (ref 0–99)
Triglycerides: 128 mg/dL (ref 0–149)
VLDL Cholesterol Cal: 23 mg/dL (ref 5–40)

## 2019-08-08 LAB — THYROID PANEL WITH TSH
Free Thyroxine Index: 2.1 (ref 1.2–4.9)
T3 Uptake Ratio: 24 % (ref 24–39)
T4, Total: 8.7 ug/dL (ref 4.5–12.0)
TSH: 2.2 u[IU]/mL (ref 0.450–4.500)

## 2019-08-08 LAB — VITAMIN D 25 HYDROXY (VIT D DEFICIENCY, FRACTURES): Vit D, 25-Hydroxy: 39.4 ng/mL (ref 30.0–100.0)

## 2019-08-08 NOTE — Progress Notes (Signed)
CBC, cholesterol, thyroid, and Vit D all normal.  Glucose slightly elevated at 110. Limit intake of sugary foods and drinks. Liver and kidney functions normal.

## 2019-09-06 ENCOUNTER — Other Ambulatory Visit: Payer: Self-pay

## 2019-09-06 DIAGNOSIS — R928 Other abnormal and inconclusive findings on diagnostic imaging of breast: Secondary | ICD-10-CM

## 2019-09-10 ENCOUNTER — Other Ambulatory Visit (HOSPITAL_COMMUNITY): Payer: Self-pay | Admitting: Family Medicine

## 2019-09-10 DIAGNOSIS — N63 Unspecified lump in unspecified breast: Secondary | ICD-10-CM

## 2019-09-18 ENCOUNTER — Other Ambulatory Visit: Payer: Self-pay

## 2019-09-18 ENCOUNTER — Ambulatory Visit (HOSPITAL_COMMUNITY)
Admission: RE | Admit: 2019-09-18 | Discharge: 2019-09-18 | Disposition: A | Discharge status: Home / Self Care | Payer: Medicare HMO | Source: Ambulatory Visit | Attending: Family Medicine | Admitting: Family Medicine

## 2019-09-18 ENCOUNTER — Ambulatory Visit (HOSPITAL_COMMUNITY): Payer: Medicare HMO

## 2019-09-18 DIAGNOSIS — R928 Other abnormal and inconclusive findings on diagnostic imaging of breast: Secondary | ICD-10-CM

## 2019-09-18 DIAGNOSIS — R922 Inconclusive mammogram: Secondary | ICD-10-CM | POA: Diagnosis not present

## 2019-09-18 DIAGNOSIS — N6312 Unspecified lump in the right breast, upper inner quadrant: Secondary | ICD-10-CM | POA: Diagnosis not present

## 2019-09-24 ENCOUNTER — Other Ambulatory Visit: Payer: Self-pay

## 2019-09-24 ENCOUNTER — Other Ambulatory Visit (HOSPITAL_COMMUNITY)
Admission: RE | Admit: 2019-09-24 | Discharge: 2019-09-24 | Disposition: A | Discharge status: Home / Self Care | Payer: Medicare HMO | Source: Ambulatory Visit | Attending: Internal Medicine | Admitting: Internal Medicine

## 2019-09-24 DIAGNOSIS — Z01812 Encounter for preprocedural laboratory examination: Secondary | ICD-10-CM | POA: Diagnosis not present

## 2019-09-24 DIAGNOSIS — Z20822 Contact with and (suspected) exposure to covid-19: Secondary | ICD-10-CM | POA: Diagnosis not present

## 2019-09-24 LAB — SARS CORONAVIRUS 2 (TAT 6-24 HRS): SARS Coronavirus 2: NEGATIVE

## 2019-09-25 ENCOUNTER — Other Ambulatory Visit (HOSPITAL_COMMUNITY): Payer: Medicare HMO

## 2019-09-25 ENCOUNTER — Encounter (HOSPITAL_COMMUNITY): Payer: Medicare HMO

## 2019-09-26 ENCOUNTER — Other Ambulatory Visit: Payer: Self-pay

## 2019-09-26 ENCOUNTER — Encounter (HOSPITAL_COMMUNITY)
Admission: RE | Disposition: A | Discharge status: Home / Self Care | Payer: Self-pay | Source: Home / Self Care | Attending: Internal Medicine

## 2019-09-26 ENCOUNTER — Ambulatory Visit (HOSPITAL_COMMUNITY)
Admission: RE | Admit: 2019-09-26 | Discharge: 2019-09-26 | Disposition: A | Discharge status: Home / Self Care | Payer: Medicare HMO | Attending: Internal Medicine | Admitting: Internal Medicine

## 2019-09-26 DIAGNOSIS — J45909 Unspecified asthma, uncomplicated: Secondary | ICD-10-CM | POA: Insufficient documentation

## 2019-09-26 DIAGNOSIS — I10 Essential (primary) hypertension: Secondary | ICD-10-CM | POA: Diagnosis not present

## 2019-09-26 DIAGNOSIS — Z79899 Other long term (current) drug therapy: Secondary | ICD-10-CM | POA: Diagnosis not present

## 2019-09-26 DIAGNOSIS — K640 First degree hemorrhoids: Secondary | ICD-10-CM | POA: Insufficient documentation

## 2019-09-26 DIAGNOSIS — D120 Benign neoplasm of cecum: Secondary | ICD-10-CM | POA: Diagnosis not present

## 2019-09-26 DIAGNOSIS — Z86010 Personal history of colonic polyps: Secondary | ICD-10-CM | POA: Diagnosis not present

## 2019-09-26 DIAGNOSIS — D123 Benign neoplasm of transverse colon: Secondary | ICD-10-CM | POA: Insufficient documentation

## 2019-09-26 DIAGNOSIS — K635 Polyp of colon: Secondary | ICD-10-CM | POA: Diagnosis not present

## 2019-09-26 DIAGNOSIS — Z87891 Personal history of nicotine dependence: Secondary | ICD-10-CM | POA: Diagnosis not present

## 2019-09-26 DIAGNOSIS — Z1211 Encounter for screening for malignant neoplasm of colon: Secondary | ICD-10-CM | POA: Diagnosis not present

## 2019-09-26 DIAGNOSIS — K5730 Diverticulosis of large intestine without perforation or abscess without bleeding: Secondary | ICD-10-CM | POA: Diagnosis not present

## 2019-09-26 HISTORY — PX: COLONOSCOPY: SHX5424

## 2019-09-26 HISTORY — PX: POLYPECTOMY: SHX5525

## 2019-09-26 SURGERY — COLONOSCOPY
Anesthesia: Moderate Sedation

## 2019-09-26 MED ORDER — MIDAZOLAM HCL 5 MG/5ML IJ SOLN
INTRAMUSCULAR | Status: AC
  Filled 2019-09-26: qty 10

## 2019-09-26 MED ORDER — ONDANSETRON HCL 4 MG/2ML IJ SOLN
INTRAMUSCULAR | Status: DC | PRN
  Administered 2019-09-26: 4 mg via INTRAVENOUS

## 2019-09-26 MED ORDER — ONDANSETRON HCL 4 MG/2ML IJ SOLN
INTRAMUSCULAR | Status: AC
  Filled 2019-09-26: qty 2

## 2019-09-26 MED ORDER — MEPERIDINE HCL 50 MG/ML IJ SOLN
INTRAMUSCULAR | Status: AC
  Filled 2019-09-26: qty 1

## 2019-09-26 MED ORDER — MIDAZOLAM HCL 5 MG/5ML IJ SOLN
INTRAMUSCULAR | Status: DC | PRN
  Administered 2019-09-26: 2 mg via INTRAVENOUS
  Administered 2019-09-26 (×3): 1 mg via INTRAVENOUS

## 2019-09-26 MED ORDER — MEPERIDINE HCL 100 MG/ML IJ SOLN
INTRAMUSCULAR | Status: DC | PRN
  Administered 2019-09-26: 25 mg
  Administered 2019-09-26: 15 mg

## 2019-09-26 MED ORDER — SODIUM CHLORIDE 0.9 % IV SOLN: INTRAVENOUS | Status: DC

## 2019-09-26 NOTE — Discharge Instructions (Signed)
Colonoscopy Discharge Instructions  Read the instructions outlined below and refer to this sheet in the next few weeks. These discharge instructions provide you with general information on caring for yourself after you leave the hospital. Your doctor may also give you specific instructions. While your treatment has been planned according to the most current medical practices available, unavoidable complications occasionally occur. If you have any problems or questions after discharge, call Dr. Gala Romney at [phone]. ACTIVITY  You may resume your regular activity, but move at a slower pace for the next 24 hours.   Take frequent rest periods for the next 24 hours.   Walking will help get rid of the air and reduce the bloated feeling in your belly (abdomen).   No driving for 24 hours (because of the medicine (anesthesia) used during the test).    Do not sign any important legal documents or operate any machinery for 24 hours (because of the anesthesia used during the test).  NUTRITION  Drink plenty of fluids.   You may resume your normal diet as instructed by your doctor.   Begin with a light meal and progress to your normal diet. Heavy or fried foods are harder to digest and may make you feel sick to your stomach (nauseated).   Avoid alcoholic beverages for 24 hours or as instructed.  MEDICATIONS  You may resume your normal medications unless your doctor tells you otherwise.  WHAT YOU CAN EXPECT TODAY  Some feelings of bloating in the abdomen.   Passage of more gas than usual.   Spotting of blood in your stool or on the toilet paper.  IF YOU HAD POLYPS REMOVED DURING THE COLONOSCOPY:  No aspirin products for 7 days or as instructed.   No alcohol for 7 days or as instructed.   Eat a soft diet for the next 24 hours.  FINDING OUT THE RESULTS OF YOUR TEST Not all test results are available during your visit. If your test results are not back during the visit, make an appointment  with your caregiver to find out the results. Do not assume everything is normal if you have not heard from your caregiver or the medical facility. It is important for you to follow up on all of your test results.  SEEK IMMEDIATE MEDICAL ATTENTION IF:  You have more than a spotting of blood in your stool.   Your belly is swollen (abdominal distention).   You are nauseated or vomiting.   You have a temperature over 101.   You have abdominal pain or discomfort that is severe or gets worse throughout the day.    Diverticulosis  Diverticulosis is a condition that develops when small pouches (diverticula) form in the wall of the large intestine (colon). The colon is where water is absorbed and stool (feces) is formed. The pouches form when the inside layer of the colon pushes through weak spots in the outer layers of the colon. You may have a few pouches or many of them. The pouches usually do not cause problems unless they become inflamed or infected. When this happens, the condition is called diverticulitis. What are the causes? The cause of this condition is not known. What increases the risk? The following factors may make you more likely to develop this condition:  Being older than age 36. Your risk for this condition increases with age. Diverticulosis is rare among people younger than age 78. By age 39, many people have it.  Eating a low-fiber diet.  Having  frequent constipation.  Being overweight.  Not getting enough exercise.  Smoking.  Taking over-the-counter pain medicines, like aspirin and ibuprofen.  Having a family history of diverticulosis. What are the signs or symptoms? In most people, there are no symptoms of this condition. If you do have symptoms, they may include:  Bloating.  Cramps in the abdomen.  Constipation or diarrhea.  Pain in the lower left side of the abdomen. How is this diagnosed? Because diverticulosis usually has no symptoms, it is most  often diagnosed during an exam for other colon problems. The condition may be diagnosed by:  Using a flexible scope to examine the colon (colonoscopy).  Taking an X-ray of the colon after dye has been put into the colon (barium enema).  Having a CT scan. How is this treated? You may not need treatment for this condition. Your health care provider may recommend treatment to prevent problems. You may need treatment if you have symptoms or if you previously had diverticulitis. Treatment may include:  Eating a high-fiber diet.  Taking a fiber supplement.  Taking a live bacteria supplement (probiotic).  Taking medicine to relax your colon. Follow these instructions at home: Medicines  Take over-the-counter and prescription medicines only as told by your health care provider.  If told by your health care provider, take a fiber supplement or probiotic. Constipation prevention Your condition may cause constipation. To prevent or treat constipation, you may need to:  Drink enough fluid to keep your urine pale yellow.  Take over-the-counter or prescription medicines.  Eat foods that are high in fiber, such as beans, whole grains, and fresh fruits and vegetables.  Limit foods that are high in fat and processed sugars, such as fried or sweet foods.  General instructions  Try not to strain when you have a bowel movement.  Keep all follow-up visits as told by your health care provider. This is important. Contact a health care provider if you:  Have pain in your abdomen.  Have bloating.  Have cramps.  Have not had a bowel movement in 3 days. Get help right away if:  Your pain gets worse.  Your bloating becomes very bad.  You have a fever or chills, and your symptoms suddenly get worse.  You vomit.  You have bowel movements that are bloody or black.  You have bleeding from your rectum. Summary  Diverticulosis is a condition that develops when small pouches (diverticula)  form in the wall of the large intestine (colon).  You may have a few pouches or many of them.  This condition is most often diagnosed during an exam for other colon problems.  Treatment may include increasing the fiber in your diet, taking supplements, or taking medicines. This information is not intended to replace advice given to you by your health care provider. Make sure you discuss any questions you have with your health care provider. Document Revised: 03/01/2019 Document Reviewed: 03/01/2019 Elsevier Patient Education  [place].  Colon Polyps  Polyps are tissue growths inside the body. Polyps can grow in many places, including the large intestine (colon). A polyp may be a round bump or a mushroom-shaped growth. You could have one polyp or several. Most colon polyps are noncancerous (benign). However, some colon polyps can become cancerous over time. Finding and removing the polyps early can help prevent this. What are the causes? The exact cause of colon polyps is not known. What increases the risk? You are more likely to develop this condition if  you:  Have a family history of colon cancer or colon polyps.  Are older than 79 or older than 45 if you are African American.  Have inflammatory bowel disease, such as ulcerative colitis or Crohn's disease.  Have certain hereditary conditions, such as: ? Familial adenomatous polyposis. ? Lynch syndrome. ? Turcot syndrome. ? Peutz-Jeghers syndrome.  Are overweight.  Smoke cigarettes.  Do not get enough exercise.  Drink too much alcohol.  Eat a diet that is high in fat and red meat and low in fiber.  Had childhood cancer that was treated with abdominal radiation. What are the signs or symptoms? Most polyps do not cause symptoms. If you have symptoms, they may include:  Blood coming from your rectum when having a bowel movement.  Blood in your stool. The stool may look dark red or black.  Abdominal pain.  A  change in bowel habits, such as constipation or diarrhea. How is this diagnosed? This condition is diagnosed with a colonoscopy. This is a procedure in which a lighted, flexible scope is inserted into the anus and then passed into the colon to examine the area. Polyps are sometimes found when a colonoscopy is done as part of routine cancer screening tests. How is this treated? Treatment for this condition involves removing any polyps that are found. Most polyps can be removed during a colonoscopy. Those polyps will then be tested for cancer. Additional treatment may be needed depending on the results of testing. Follow these instructions at home: Lifestyle  Maintain a healthy weight, or lose weight if recommended by your health care provider.  Exercise every day or as told by your health care provider.  Do not use any products that contain nicotine or tobacco, such as cigarettes and e-cigarettes. If you need help quitting, ask your health care provider.  If you drink alcohol, limit how much you have: ? 0-1 drink a day for women. ? 0-2 drinks a day for men.  Be aware of how much alcohol is in your drink. In the [place] one drink equals one 12 oz bottle of beer (355 mL), one 5 oz glass of wine (148 mL), or one 1 oz shot of hard liquor (44 mL). Eating and drinking   Eat foods that are high in fiber, such as fruits, vegetables, and whole grains.  Eat foods that are high in calcium and vitamin D, such as milk, cheese, yogurt, eggs, liver, fish, and broccoli.  Limit foods that are high in fat, such as fried foods and desserts.  Limit the amount of red meat and processed meat you eat, such as hot dogs, sausage, bacon, and lunch meats. General instructions  Keep all follow-up visits as told by your health care provider. This is important. ? This includes having regularly scheduled colonoscopies. ? Talk to your health care provider about when you need a colonoscopy. Contact a health care  provider if:  You have new or worsening bleeding during a bowel movement.  You have new or increased blood in your stool.  You have a change in bowel habits.  You lose weight for no known reason. Summary  Polyps are tissue growths inside the body. Polyps can grow in many places, including the colon.  Most colon polyps are noncancerous (benign), but some can become cancerous over time.  This condition is diagnosed with a colonoscopy.  Treatment for this condition involves removing any polyps that are found. Most polyps can be removed during a colonoscopy. This information is not intended  to replace advice given to you by your health care provider. Make sure you discuss any questions you have with your health care provider. Document Revised: 11/17/2017 Document Reviewed: 11/17/2017 Elsevier Patient Education  [place].   Colon polyp and diverticulosis information provided  Further recommendations to follow pending review of pathology report  At patient request, I called Arbie Cookey at [phone] -reviewed results

## 2019-09-26 NOTE — H&P (Addendum)
@LOGO @   Primary Care Physician:  Baruch Gouty, FNP Primary Gastroenterologist:  Dr. Gala Romney  Pre-Procedure History & Physical: HPI:  Rebecca Macdonald is a 74 y.o. female here for surveillance colonoscopy.  History of multiple colonic adenomas removed in 2017.  Past Medical History:  Diagnosis Date  . Asthma   . Cataract   . Hypertension     Past Surgical History:  Procedure Laterality Date  . CHOLECYSTECTOMY  2013  . COLONOSCOPY N/A 06/15/2016   Procedure: COLONOSCOPY;  Surgeon: Daneil Dolin, MD;  [facility];  Service: Endoscopy;  Laterality: N/A;  1215  . POLYPECTOMY  06/15/2016   Procedure: POLYPECTOMY;  Surgeon: Daneil Dolin, MD;  [facility]: [facility];  Service: Endoscopy;;  colon    Prior to Admission medications   Medication Sig Start Date End Date Taking? Authorizing Provider  acetaminophen (TYLENOL) 500 MG tablet Take 500 mg by mouth daily as needed for moderate pain or headache.    Yes [provider]  alendronate (FOSAMAX) 70 MG tablet Take 70 mg by mouth every Sunday. Take with a full glass of water on an empty stomach.    Yes [provider]  Budeson-Glycopyrrol-Formoterol (BREZTRI AEROSPHERE) 160-9-4.8 MCG/ACT AERO Inhale 1 puff into the lungs daily.   Yes [provider]  Calcium-Magnesium-Vitamin D (CALCIUM 1200+D3 PO) Take 1 tablet by mouth daily.   Yes [provider]  Cholecalciferol (VITAMIN D) 50 MCG (2000 UT) tablet Take 2,000 Units by mouth daily.   Yes [provider]  famotidine (PEPCID) 20 MG tablet Take 1 tablet (20 mg total) by mouth 2 (two) times daily. 05/02/19  Yes Rakes, Connye Burkitt, FNP  loratadine (CLARITIN) 10 MG tablet Take 1 tablet (10 mg total) by mouth daily. Patient taking differently: Take 10 mg by mouth daily as needed for allergies.  01/16/19  Yes Rakes, Connye Burkitt, FNP  pravastatin (PRAVACHOL) 40 MG tablet Take 1 tablet (40 mg total) by mouth daily. 01/16/19  Yes Rakes, Connye Burkitt, FNP   Tetrahydrozoline HCl (VISINE OP) Place 1 drop into both eyes daily as needed (irritation).   Yes [provider]  budesonide-formoterol (SYMBICORT) 80-4.5 MCG/ACT inhaler Inhale 2 puffs into the lungs 2 (two) times daily. Patient not taking: Reported on 09/14/2019 01/16/19   Baruch Gouty, FNP    Allergies as of 07/16/2019  . (No Known Allergies)    Family History  Problem Relation Age of Onset  . Colon polyps Brother        high grade required partial colectomy but not cancer.   . Colon cancer Neg Hx     Social History   Socioeconomic History  . Marital status: Widowed    Spouse name: Not on file  . Number of children: 1  . Years of education: Not on file  . Highest education level: 11th grade  Occupational History  . Occupation: Retired  Tobacco Use  . Smoking status: Former Smoker    Quit date: 03/13/2009    Years since quitting: 10.5  . Smokeless tobacco: Never Used  Substance and Sexual Activity  . Alcohol use: No  . Drug use: No  . Sexual activity: Yes  Other Topics Concern  . Not on file  Social History Narrative  . Not on file   Social Determinants of Health   Financial Resource Strain: Low Risk   . Difficulty of Paying Living Expenses: Not hard at all  Food Insecurity: No Food Insecurity  . Worried About  Running Out of Food in the Last Year: Never true  . Ran Out of Food in the Last Year: Never true  Transportation Needs: No Transportation Needs  . Lack of Transportation (Medical): No  . Lack of Transportation (Non-Medical): No  Physical Activity: Inactive  . Days of Exercise per Week: 0 days  . Minutes of Exercise per Session: 0 min  Stress: No Stress Concern Present  . Feeling of Stress : Not at all  Social Connections: Moderately Isolated  . Frequency of Communication with Friends and Family: More than three times a week  . Frequency of Social Gatherings with Friends and Family: More than three times a week  . Attends Religious Services:  Never  . Active Member of Clubs or Organizations: No  . Attends Archivist Meetings: Never  . Marital Status: Widowed  Intimate Partner Violence: Not At Risk  . Fear of Current or Ex-Partner: No  . Emotionally Abused: No  . Physically Abused: No  . Sexually Abused: No    Review of Systems: See HPI, otherwise negative ROS  Physical Exam: BP 123/69   Pulse 96   Temp 97.8 F (36.6 C) (Oral)   Resp 18   SpO2 98%  General:   Alert,  Well-developed, well-nourished, pleasant and cooperative in NAD Neck:  Supple; no masses or thyromegaly. No significant cervical adenopathy. Lungs:  Clear throughout to auscultation.   No wheezes, crackles, or rhonchi. No acute distress. Heart:  Regular rate and rhythm; no murmurs, clicks, rubs,  or gallops. Abdomen: Non-distended, normal bowel sounds.  Soft and nontender without appreciable mass or hepatosplenomegaly.  Pulses:  Normal pulses noted. Extremities:  Without clubbing or edema.  Impression: Pleasant 74 year old lady with history of multiple colonic adenomas.  Here for surveillance colonoscopy per plan.  The risks, benefits, limitations, alternatives and imponderables have been reviewed with the patient. Questions have been answered. All parties are agreeable.      Notice: This dictation was prepared with Dragon dictation along with smaller phrase technology. Any transcriptional errors that result from this process are unintentional and may not be corrected upon review.

## 2019-09-26 NOTE — Op Note (Signed)
[facility] Patient Name: Rebecca Macdonald Procedure Date: 09/26/2019 12:14 PM MRN: [id number] Date of Birth: 06-12-1946 Attending MD: Norvel Richards , MD CSN: [id number] Age: 74 Admit Type: Outpatient Procedure:                Colonoscopy Indications:              High risk colon cancer surveillance: Personal                            history of colonic polyps Providers:                Norvel Richards, MD, Janeece Riggers, RN, Starla Link RN, RN, Nelma Rothman, Technician Referring MD:              Medicines:                Midazolam 5 mg IV, Meperidine 40 mg IV, Ondansetron                            4 mg IV Complications:            No immediate complications. Estimated Blood Loss:     Estimated blood loss was minimal. Procedure:                Pre-Anesthesia Assessment:                           - Prior to the procedure, a History and Physical                            was performed, and patient medications and                            allergies were reviewed. The patient's tolerance of                            previous anesthesia was also reviewed. The risks                            and benefits of the procedure and the sedation                            options and risks were discussed with the patient.                            All questions were answered, and informed consent                            was obtained. Prior Anticoagulants: The patient has                            taken no previous anticoagulant or antiplatelet  agents. ASA Grade Assessment: II - A patient with                            mild systemic disease. After reviewing the risks                            and benefits, the patient was deemed in                            satisfactory condition to undergo the procedure.                           After obtaining informed consent, the colonoscope                            was passed  under direct vision. Throughout the                            procedure, the patient's blood pressure, pulse, and                            oxygen saturations were monitored continuously. The                            [id number] scope was introduced through                            the anus and advanced to the the cecum, identified                            by appendiceal orifice and ileocecal valve. The                            colonoscopy was performed without difficulty. The                            patient tolerated the procedure well. The quality                            of the bowel preparation was adequate. Scope In: 12:30:55 PM Scope Out: 12:52:49 PM Scope Withdrawal Time: 0 hours 16 minutes 38 seconds  Total Procedure Duration: 0 hours 21 minutes 54 seconds  Findings:      The perianal and digital rectal examinations were normal. Rectal mucosa       appeared normal on face. Rectum too small to retroflex.      Non-bleeding internal hemorrhoids were found during endoscopy. The       hemorrhoids were moderate, medium-sized and Grade I (internal       hemorrhoids that do not prolapse).      Scattered medium-mouthed diverticula were found in the sigmoid colon and       descending colon.      Three sessile polyps were found in the splenic flexure and ileocecal       valve. The polyps were 5 to 8 mm in size. These  polyps were removed with       a cold snare. Resection and retrieval were complete. Estimated blood       loss was minimal.      The exam was otherwise without abnormality. Impression:               - Non-bleeding internal hemorrhoids.                           - Diverticulosis in the sigmoid colon and in the                            descending colon.                           - Three 5 to 8 mm polyps at the splenic flexure and                            at the ileocecal valve, removed with a cold snare.                            Resected and  retrieved.                           - The examination was otherwise normal. Moderate Sedation:      Moderate (conscious) sedation was administered by the endoscopy nurse       and supervised by the endoscopist. The following parameters were       monitored: oxygen saturation, heart rate, blood pressure, respiratory       rate, EKG, adequacy of pulmonary ventilation, and response to care.       Total physician intraservice time was 25 minutes. Recommendation:           - Patient has a contact number available for                            emergencies. The signs and symptoms of potential                            delayed complications were discussed with the                            patient. Return to normal activities tomorrow.                            Written discharge instructions were provided to the                            patient.                           - Resume previous diet.                           - Continue present medications.                           -  Repeat colonoscopy in 5 years for surveillance                            based on pathology results.                           - Return to GI office (date not yet determined). Procedure Code(s):        --- Professional ---                           [phone], Colonoscopy, flexible; with removal of                            tumor(s), polyp(s), or other lesion(s) by snare                            technique                           99153, Moderate sedation; each additional 15                            minutes intraservice time                           G0500, Moderate sedation services provided by the                            same physician or other qualified health care                            professional performing a gastrointestinal                            endoscopic service that sedation supports,                            requiring the presence of an independent trained                             observer to assist in the monitoring of the                            patient's level of consciousness and physiological                            status; initial 15 minutes of intra-service time;                            patient age 11 years or older (additional time may                            be reported with [phone], as appropriate) Diagnosis Code(s):        --- Professional ---  Z86.010, Personal history of colonic polyps                           K64.0, First degree hemorrhoids                           K63.5, Polyp of colon                           K57.30, Diverticulosis of large intestine without                            perforation or abscess without bleeding CPT copyright 2019 American Medical Association. All rights reserved. The codes documented in this report are preliminary and upon coder review may  be revised to meet current compliance requirements. Cristopher Estimable. Adiya Selmer, MD Norvel Richards, MD 09/26/2019 1:02:17 PM This report has been signed electronically. Number of Addenda: 0

## 2019-09-27 ENCOUNTER — Encounter: Payer: Self-pay | Admitting: Internal Medicine

## 2019-09-27 LAB — SURGICAL PATHOLOGY

## 2019-11-23 ENCOUNTER — Encounter: Payer: Self-pay | Admitting: *Deleted

## 2020-02-05 ENCOUNTER — Ambulatory Visit (INDEPENDENT_AMBULATORY_CARE_PROVIDER_SITE_OTHER): Payer: Medicare HMO | Admitting: *Deleted

## 2020-02-05 DIAGNOSIS — Z0000 Encounter for general adult medical examination without abnormal findings: Secondary | ICD-10-CM

## 2020-02-05 NOTE — Progress Notes (Signed)
MEDICARE ANNUAL WELLNESS VISIT  02/05/2020  Telephone Visit Disclaimer This Medicare AWV was conducted by telephone due to national recommendations for restrictions regarding the COVID-19 Pandemic (e.g. social distancing).  I verified, using two identifiers, that I am speaking with Rebecca Macdonald or their authorized healthcare agent. I discussed the limitations, risks, security, and privacy concerns of performing an evaluation and management service by telephone and the potential availability of an in-person appointment in the future. The patient expressed understanding and agreed to proceed.   Subjective:  Rebecca Macdonald is a 74 y.o. female patient of [name], [place] who had a Medicare Annual Wellness Visit today via telephone. [name] is Retired and lives alone. she has 1 child. she reports that she is not socially active and does interact with friends/family regularly. she is minimally physically active and enjoys gardening and tv  Patient Care Team: [name], FNP as PCP - General (Family Medicine) Daneil Dolin, MD as Consulting Physician (Gastroenterology)  Advanced Directives 02/05/2020 09/26/2019 01/26/2019 06/15/2016  Does Patient Have a Medical Advance Directive? No No No No  Would patient like information on creating a medical advance directive? No - Patient declined No - Patient declined Yes (MAU/Ambulatory/Procedural Areas - Information given) No - patient declined information    Hospital Utilization Over the Past 12 Months: # of hospitalizations or ER visits: 0 # of surgeries: 0  Review of Systems    Patient reports that her overall health is unchanged compared to last year.  History obtained from chart review and the patient  Patient Reported Readings (BP, Pulse, CBG, Weight, etc) none  Pain Assessment Pain : No/denies pain     Current Medications & Allergies (verified) Allergies as of 02/05/2020   No Known Allergies     Medication List         Accurate as of February 05, 2020  3:00 PM. If you have any questions, ask your nurse or doctor.        acetaminophen 500 MG tablet Commonly known as: TYLENOL Take 500 mg by mouth daily as needed for moderate pain or headache.   alendronate 70 MG tablet Commonly known as: FOSAMAX Take 70 mg by mouth every Sunday. Take with a full glass of water on an empty stomach.   Breztri Aerosphere 160-9-4.8 MCG/ACT Aero Generic drug: Budeson-Glycopyrrol-Formoterol Inhale 1 puff into the lungs daily.   CALCIUM 1200+D3 PO Take 1 tablet by mouth daily.   famotidine 20 MG tablet Commonly known as: Pepcid Take 1 tablet (20 mg total) by mouth 2 (two) times daily.   loratadine 10 MG tablet Commonly known as: CLARITIN Take 1 tablet (10 mg total) by mouth daily. What changed:   when to take this  reasons to take this   pravastatin 40 MG tablet Commonly known as: PRAVACHOL Take 1 tablet (40 mg total) by mouth daily.   VISINE OP Place 1 drop into both eyes daily as needed (irritation).   Vitamin D 50 MCG (2000 UT) tablet Take 2,000 Units by mouth daily.       History (reviewed): Past Medical History:  Diagnosis Date  . Asthma   . Cataract   . Hypertension    Past Surgical History:  Procedure Laterality Date  . CHOLECYSTECTOMY  2013  . COLONOSCOPY N/A 06/15/2016   Procedure: COLONOSCOPY;  Surgeon: Daneil Dolin, MD;  [facility];  Service: Endoscopy;  Laterality: N/A;  1215  . COLONOSCOPY N/A 09/26/2019   Procedure: COLONOSCOPY;  Surgeon:  Rourk, Cristopher Estimable, MD;  [facility]: [facility];  Service: Endoscopy;  Laterality: N/A;  1:00-office said pt can't come any earlier d/t transportation  . POLYPECTOMY  06/15/2016   Procedure: POLYPECTOMY;  Surgeon: Daneil Dolin, MD;  [facility];  Service: Endoscopy;;  colon  . POLYPECTOMY  09/26/2019   Procedure: POLYPECTOMY;  Surgeon: Daneil Dolin, MD;  [facility];  Service: Endoscopy;;   Family History   Problem Relation Age of Onset  . Colon polyps Brother        high grade required partial colectomy but not cancer.   . Colon cancer Neg Hx    Social History   Socioeconomic History  . Marital status: Widowed    Spouse name: Not on file  . Number of children: 1  . Years of education: Not on file  . Highest education level: 11th grade  Occupational History  . Occupation: Retired  Tobacco Use  . Smoking status: Former Smoker    Quit date: 03/13/2009    Years since quitting: 10.9  . Smokeless tobacco: Never Used  Vaping Use  . Vaping Use: Never used  Substance and Sexual Activity  . Alcohol use: No  . Drug use: No  . Sexual activity: Yes  Other Topics Concern  . Not on file  Social History Narrative  . Not on file   Social Determinants of Health   Financial Resource Strain:   . Difficulty of Paying Living Expenses:   Food Insecurity:   . Worried About Charity fundraiser in the Last Year:   . Arboriculturist in the Last Year:   Transportation Needs:   . Film/video editor (Medical):   Marland Kitchen Lack of Transportation (Non-Medical):   Physical Activity:   . Days of Exercise per Week:   . Minutes of Exercise per Session:   Stress:   . Feeling of Stress :   Social Connections:   . Frequency of Communication with Friends and Family:   . Frequency of Social Gatherings with Friends and Family:   . Attends Religious Services:   . Active Member of Clubs or Organizations:   . Attends Archivist Meetings:   Marland Kitchen Marital Status:     Activities of Daily Living In your present state of health, do you have any difficulty performing the following activities: 02/05/2020  Hearing? Y  Comment hard to hear at times  Vision? Y  Comment due exam this year  Difficulty concentrating or making decisions? N  Walking or climbing stairs? N  Dressing or bathing? N  Doing errands, shopping? N  Preparing Food and eating ? N  Using the Toilet? N  In the past six months, have you  accidently leaked urine? N  Do you have problems with loss of bowel control? N  Managing your Medications? N  Managing your Finances? N  Housekeeping or managing your Housekeeping? N  Some recent data might be hidden    Patient Education/ Literacy How often do you need to have someone help you when you read instructions, pamphlets, or other written materials from your doctor or pharmacy?: 1 - Never What is the last grade level you completed in school?: 11  Exercise Current Exercise Habits: The patient does not participate in regular exercise at present, Exercise limited by: None identified  Diet Patient reports consuming 2 meals a day and 1 snack(s) a day Patient reports that her primary diet is: Regular Patient reports that she  does have regular access to food.   Depression Screen PHQ 2/9 Scores 02/05/2020 08/07/2019 05/02/2019 01/26/2019 01/16/2019 07/06/2018 07/04/2018  PHQ - 2 Score 0 1 0 0 0 0 0     Fall Risk Fall Risk  02/05/2020 08/07/2019 05/02/2019 01/26/2019 01/16/2019  Falls in the past year? 0 0 0 0 0  Number falls in past yr: - - - 0 -  Injury with Fall? - - - 0 -     Objective:  Chrishelle Dornfeld seemed alert and oriented and she participated appropriately during our telephone visit.  Blood Pressure Weight BMI  BP Readings from Last 3 Encounters:  09/26/19 (!) 98/57  08/07/19 122/63  05/02/19 130/62   Wt Readings from Last 3 Encounters:  08/07/19 120 lb (54.4 kg)  05/02/19 118 lb (53.5 kg)  01/26/19 122 lb (55.3 kg)   BMI Readings from Last 1 Encounters:  08/07/19 25.08 kg/m    *Unable to obtain current vital signs, weight, and BMI due to telephone visit type  Hearing/Vision  . Betzaida did not seem to have difficulty with hearing/understanding during the telephone conversation . Reports that she has had a formal eye exam by an eye care professional within the past year . Reports that she has not had a formal hearing evaluation within the past year *Unable to fully  assess hearing and vision during telephone visit type  Cognitive Function: 6CIT Screen 02/05/2020 01/26/2019  What Year? 0 points 0 points  What month? 0 points 0 points  What time? 0 points 0 points  Count back from 20 0 points 2 points  Months in reverse 4 points 2 points  Repeat phrase 2 points 0 points  Total Score 6 4   (Normal:0-7, Significant for Dysfunction: >8)  Normal Cognitive Function Screening: Yes   Immunization & Health Maintenance Record Immunization History  Administered Date(s) Administered  . Fluad Quad(high Dose 65+) 05/02/2019  . Influenza, High Dose Seasonal PF 07/26/2018  . Influenza,inj,Quad PF,6+ Mos 06/21/2013, 06/25/2016, 07/01/2017  . Moderna SARS-COVID-2 Vaccination 10/16/2019, 11/13/2019  . Pneumococcal Conjugate-13 06/21/2013  . Pneumococcal Polysaccharide-23 04/16/2016    Health Maintenance  Topic Date Due  . TETANUS/TDAP  08/06/2020 (Originally 05/19/1965)  . INFLUENZA VACCINE  03/16/2020  . MAMMOGRAM  03/17/2020  . COLONOSCOPY  09/25/2022  . DEXA SCAN  Completed  . COVID-19 Vaccine  Completed  . Hepatitis C Screening  Completed  . PNA vac Low Risk Adult  Completed       Assessment  This is a routine wellness examination for [facility].  Health Maintenance: Due or Overdue There are no preventive care reminders to display for this patient.  Beverlie Kurihara does not need a referral for Community Assistance: Care Management:   no Social Work:    not applicable Prescription Assistance:  no Nutrition/Diabetes Education:  no   Plan:  Personalized Goals Goals Addressed            This Visit's Progress   . DIET - INCREASE WATER INTAKE      . Exercise 3x per week (30 min per time)   Not on track    Try to exercise for at least 30 minutes, 3 times weekly    . Increase physical activity        Personalized Health Maintenance & Screening Recommendations    Lung Cancer Screening Recommended: no (Low Dose CT Chest recommended if Age  34-80 years, 30 pack-year currently smoking OR have quit w/in past 15 years) Hepatitis C Screening  recommended: no HIV Screening recommended: no  Advanced Directives: Written information was not prepared per patient's request.  Referrals & Orders No orders of the defined types were placed in this encounter.   Follow-up Plan . Follow-up with [name], FNP as planned on 02/07/20 . Pt is independent with all ADL's . Health maintenance is up to date. . We can offer shingles vaccine at next appt . Hearing and vision both good per pt. . Patient declined information on advanced directives. . Pt voices no healthcare concerns . AVS printed and mailed to pt .   I have personally reviewed and noted the following in the patient's chart:   . Medical and social history . Use of alcohol, tobacco or illicit drugs  . Current medications and supplements . Functional ability and status . Nutritional status . Physical activity . Advanced directives . List of other physicians . Hospitalizations, surgeries, and ER visits in previous 12 months . Vitals . Screenings to include cognitive, depression, and falls . Referrals and appointments  In addition, I have reviewed and discussed with Rebecca Macdonald certain preventive protocols, quality metrics, and best practice recommendations. A written personalized care plan for preventive services as well as general preventive health recommendations is available and can be mailed to the patient at her request.      Rana Snare, LPN  [phone]

## 2020-02-07 ENCOUNTER — Encounter: Payer: Self-pay | Admitting: Nurse Practitioner

## 2020-02-07 ENCOUNTER — Ambulatory Visit (INDEPENDENT_AMBULATORY_CARE_PROVIDER_SITE_OTHER): Payer: Medicare HMO | Admitting: Nurse Practitioner

## 2020-02-07 ENCOUNTER — Other Ambulatory Visit: Payer: Self-pay

## 2020-02-07 ENCOUNTER — Ambulatory Visit: Payer: Medicare HMO | Admitting: Family Medicine

## 2020-02-07 VITALS — BP 132/82 | HR 72 | Temp 97.7°F | Resp 20 | Ht <= 58 in | Wt 121.0 lb

## 2020-02-07 DIAGNOSIS — E782 Mixed hyperlipidemia: Secondary | ICD-10-CM

## 2020-02-07 DIAGNOSIS — M810 Age-related osteoporosis without current pathological fracture: Secondary | ICD-10-CM

## 2020-02-07 DIAGNOSIS — E559 Vitamin D deficiency, unspecified: Secondary | ICD-10-CM

## 2020-02-07 DIAGNOSIS — Z6825 Body mass index (BMI) 25.0-25.9, adult: Secondary | ICD-10-CM

## 2020-02-07 DIAGNOSIS — K219 Gastro-esophageal reflux disease without esophagitis: Secondary | ICD-10-CM

## 2020-02-07 DIAGNOSIS — Z23 Encounter for immunization: Secondary | ICD-10-CM

## 2020-02-07 MED ORDER — PRAVASTATIN SODIUM 40 MG PO TABS: 40.0000 mg | ORAL_TABLET | Freq: Every day | ORAL | 3 refills | Status: DC

## 2020-02-07 MED ORDER — FAMOTIDINE 20 MG PO TABS: 20.0000 mg | ORAL_TABLET | Freq: Two times a day (BID) | ORAL | 3 refills | Status: DC

## 2020-02-07 NOTE — Patient Instructions (Signed)

## 2020-02-07 NOTE — Addendum Note (Signed)
Addended by: Rolena Infante on: 02/07/2020 04:42 PM   Modules accepted: Orders

## 2020-02-07 NOTE — Progress Notes (Signed)
Subjective:    Patient ID: Rebecca Macdonald, female    DOB: 1946-05-19, 74 y.o.   MRN: [id number]   Chief Complaint: medical management of chronic issues     HPI:  1. Mixed hyperlipidemia Does not watch diet very closely. She tries to walk some daily. Lab Results  Component Value Date   CHOL 141 08/07/2019   HDL 41 08/07/2019   LDLCALC 77 08/07/2019   TRIG 128 08/07/2019   CHOLHDL 3.4 08/07/2019     2. Gastroesophageal reflux disease without esophagitis Takes pepcid daily and that works well to keep her symptoms under control.  3. Vitamin D deficiency Takes a daily vitamin d supplement  4. Age-related osteoporosis without current pathological fracture Last dexascan was done on 07/04/18 t score was -3.5. she is on fosamax weekly without side effects  5. BMI 25.0-25.9,adult No recent weight changes Wt Readings from Last 3 Encounters:  02/07/20 121 lb (54.9 kg)  08/07/19 120 lb (54.4 kg)  05/02/19 118 lb (53.5 kg)   BMI Readings from Last 3 Encounters:  02/07/20 25.29 kg/m  08/07/19 25.08 kg/m  05/02/19 24.66 kg/m       Outpatient Encounter Medications as of 02/07/2020  Medication Sig  . acetaminophen (TYLENOL) 500 MG tablet Take 500 mg by mouth daily as needed for moderate pain or headache.   . alendronate (FOSAMAX) 70 MG tablet Take 70 mg by mouth every Sunday. Take with a full glass of water on an empty stomach.   . Budeson-Glycopyrrol-Formoterol (BREZTRI AEROSPHERE) 160-9-4.8 MCG/ACT AERO Inhale 1 puff into the lungs daily.  . Calcium-Magnesium-Vitamin D (CALCIUM 1200+D3 PO) Take 1 tablet by mouth daily.  . Cholecalciferol (VITAMIN D) 50 MCG (2000 UT) tablet Take 2,000 Units by mouth daily.  . famotidine (PEPCID) 20 MG tablet Take 1 tablet (20 mg total) by mouth 2 (two) times daily.  Marland Kitchen loratadine (CLARITIN) 10 MG tablet Take 1 tablet (10 mg total) by mouth daily. (Patient taking differently: Take 10 mg by mouth daily as needed for allergies. )  . pravastatin  (PRAVACHOL) 40 MG tablet Take 1 tablet (40 mg total) by mouth daily.  . Tetrahydrozoline HCl (VISINE OP) Place 1 drop into both eyes daily as needed (irritation).    Past Surgical History:  Procedure Laterality Date  . CHOLECYSTECTOMY  2013  . COLONOSCOPY N/A 06/15/2016   Procedure: COLONOSCOPY;  Surgeon: Daneil Dolin, MD;  [facility];  Service: Endoscopy;  Laterality: N/A;  1215  . COLONOSCOPY N/A 09/26/2019   Procedure: COLONOSCOPY;  Surgeon: Daneil Dolin, MD;  [facility]: [facility];  Service: Endoscopy;  Laterality: N/A;  1:00-office said pt can't come any earlier d/t transportation  . POLYPECTOMY  06/15/2016   Procedure: POLYPECTOMY;  Surgeon: Daneil Dolin, MD;  [facility]: [facility];  Service: Endoscopy;;  colon  . POLYPECTOMY  09/26/2019   Procedure: POLYPECTOMY;  Surgeon: Daneil Dolin, MD;  [facility];  Service: Endoscopy;;    Family History  Problem Relation Age of Onset  . Colon polyps Brother        high grade required partial colectomy but not cancer.   . Colon cancer Neg Hx     New complaints: None today  Social history: Lives by herself. She has family that checks on her daily  Controlled substance contract: n/a    Review of Systems  Constitutional: Negative for diaphoresis.  Eyes: Negative for pain.  Respiratory: Negative for shortness of breath.   Cardiovascular:  Negative for chest pain, palpitations and leg swelling.  Gastrointestinal: Negative for abdominal pain.  Endocrine: Negative for polydipsia.  Skin: Negative for rash.  Neurological: Negative for dizziness, weakness and headaches.  Hematological: Does not bruise/bleed easily.  All other systems reviewed and are negative.      Objective:   Physical Exam Vitals and nursing note reviewed.  Constitutional:      General: She is not in acute distress.    Appearance: Normal appearance. She is well-developed.  HENT:     Head: Normocephalic.     Nose: Nose  normal.  Eyes:     Pupils: Pupils are equal, round, and reactive to light.  Neck:     Vascular: No carotid bruit or JVD.  Cardiovascular:     Rate and Rhythm: Normal rate and regular rhythm.     Heart sounds: Normal heart sounds.  Pulmonary:     Effort: Pulmonary effort is normal. No respiratory distress.     Breath sounds: Normal breath sounds. No wheezing or rales.  Chest:     Chest wall: No tenderness.  Abdominal:     General: Bowel sounds are normal. There is no distension or abdominal bruit.     Palpations: Abdomen is soft. There is no hepatomegaly, splenomegaly, mass or pulsatile mass.     Tenderness: There is no abdominal tenderness.  Musculoskeletal:        General: Normal range of motion.     Cervical back: Normal range of motion and neck supple.  Lymphadenopathy:     Cervical: No cervical adenopathy.  Skin:    General: Skin is warm and dry.  Neurological:     Mental Status: She is alert and oriented to person, place, and time.     Deep Tendon Reflexes: Reflexes are normal and symmetric.  Psychiatric:        Behavior: Behavior normal.        Thought Content: Thought content normal.        Judgment: Judgment normal.     BP 132/82 (BP Location: Left Arm, Cuff Size: Normal)   Pulse 72   Temp 97.7 F (36.5 C) (Temporal)   Resp 20   Ht _0  (1.473 m)   Wt 121 lb (54.9 kg)   SpO2 98%   BMI 25.29 kg/m         Assessment & Plan:  Rebecca Macdonald comes in today with chief complaint of Medical Management of Chronic Issues   Diagnosis and orders addressed:  1. Mixed hyperlipidemia Low fat diet - pravastatin (PRAVACHOL) 40 MG tablet; Take 1 tablet (40 mg total) by mouth daily.  Dispense: 90 tablet; Refill: 3 - CBC with Differential/Platelet - CMP14+EGFR - Lipid panel  2. Gastroesophageal reflux disease without esophagitis Avoid spicy foods Do not eat 2 hours prior to bedtime - famotidine (PEPCID) 20 MG tablet; Take 1 tablet (20 mg total) by mouth 2 (two)  times daily.  Dispense: 180 tablet; Refill: 3  3. Vitamin D deficiency Continue daily vitamin d supplement  4. Age-related osteoporosis without current pathological fracture Weight bearing exercises  5. BMI 25.0-25.9,adult Discussed diet and exercise for person with BMI >25 Will recheck weight in 3-6 months   Labs pending Health Maintenance reviewed Diet and exercise encouraged  Follow up plan: 6 months   Mary-Margaret Hassell Done, FNP

## 2020-02-08 LAB — CMP14+EGFR
ALT: 26 IU/L (ref 0–32)
AST: 31 IU/L (ref 0–40)
Albumin/Globulin Ratio: 1.9 (ref 1.2–2.2)
Albumin: 4.2 g/dL (ref 3.7–4.7)
Alkaline Phosphatase: 52 IU/L (ref 48–121)
BUN/Creatinine Ratio: 19 (ref 12–28)
BUN: 12 mg/dL (ref 8–27)
Bilirubin Total: 0.4 mg/dL (ref 0.0–1.2)
CO2: 29 mmol/L (ref 20–29)
Calcium: 9 mg/dL (ref 8.7–10.3)
Chloride: 103 mmol/L (ref 96–106)
Creatinine, Ser: 0.64 mg/dL (ref 0.57–1.00)
GFR calc Af Amer: 104 mL/min/{1.73_m2} (ref >59)
GFR calc non Af Amer: 90 mL/min/{1.73_m2} (ref >59)
Globulin, Total: 2.2 g/dL (ref 1.5–4.5)
Glucose: 91 mg/dL (ref 65–99)
Potassium: 3.8 mmol/L (ref 3.5–5.2)
Sodium: 143 mmol/L (ref 134–144)
Total Protein: 6.4 g/dL (ref 6.0–8.5)

## 2020-02-08 LAB — CBC WITH DIFFERENTIAL/PLATELET
Basophils Absolute: 0 10*3/uL (ref 0.0–0.2)
Basos: 1 %
EOS (ABSOLUTE): 0.2 10*3/uL (ref 0.0–0.4)
Eos: 3 %
Hematocrit: 40.6 % (ref 34.0–46.6)
Hemoglobin: 13.4 g/dL (ref 11.1–15.9)
Immature Grans (Abs): 0 10*3/uL (ref 0.0–0.1)
Immature Granulocytes: 0 %
Lymphocytes Absolute: 2.1 10*3/uL (ref 0.7–3.1)
Lymphs: 29 %
MCH: 30.2 pg (ref 26.6–33.0)
MCHC: 33 g/dL (ref 31.5–35.7)
MCV: 91 fL (ref 79–97)
Monocytes Absolute: 0.6 10*3/uL (ref 0.1–0.9)
Monocytes: 8 %
Neutrophils Absolute: 4.3 10*3/uL (ref 1.4–7.0)
Neutrophils: 59 %
Platelets: 167 10*3/uL (ref 150–450)
RBC: 4.44 x10E6/uL (ref 3.77–5.28)
RDW: 12.8 % (ref 11.7–15.4)
WBC: 7.3 10*3/uL (ref 3.4–10.8)

## 2020-02-08 LAB — LIPID PANEL
Chol/HDL Ratio: 3.2 ratio (ref 0.0–4.4)
Cholesterol, Total: 131 mg/dL (ref 100–199)
HDL: 41 mg/dL (ref >39)
LDL Chol Calc (NIH): 74 mg/dL (ref 0–99)
Triglycerides: 84 mg/dL (ref 0–149)
VLDL Cholesterol Cal: 16 mg/dL (ref 5–40)

## 2020-02-21 ENCOUNTER — Other Ambulatory Visit: Payer: Self-pay | Admitting: *Deleted

## 2020-02-22 MED ORDER — ALENDRONATE SODIUM 70 MG PO TABS: 70.0000 mg | ORAL_TABLET | ORAL | 1 refills | Status: DC

## 2020-04-09 ENCOUNTER — Other Ambulatory Visit: Payer: Self-pay | Admitting: *Deleted

## 2020-04-09 DIAGNOSIS — J301 Allergic rhinitis due to pollen: Secondary | ICD-10-CM

## 2020-04-09 MED ORDER — LORATADINE 10 MG PO TABS: 10.0000 mg | ORAL_TABLET | Freq: Every day | ORAL | 11 refills | Status: DC

## 2020-04-28 DIAGNOSIS — Z0100 Encounter for examination of eyes and vision without abnormal findings: Secondary | ICD-10-CM | POA: Diagnosis not present

## 2020-04-28 DIAGNOSIS — H25813 Combined forms of age-related cataract, bilateral: Secondary | ICD-10-CM | POA: Diagnosis not present

## 2020-04-28 DIAGNOSIS — H5200 Hypermetropia, unspecified eye: Secondary | ICD-10-CM | POA: Diagnosis not present

## 2020-06-11 ENCOUNTER — Ambulatory Visit (INDEPENDENT_AMBULATORY_CARE_PROVIDER_SITE_OTHER): Payer: Medicare HMO

## 2020-06-11 ENCOUNTER — Other Ambulatory Visit: Payer: Self-pay

## 2020-06-11 DIAGNOSIS — Z23 Encounter for immunization: Secondary | ICD-10-CM | POA: Diagnosis not present

## 2020-07-28 ENCOUNTER — Other Ambulatory Visit: Payer: Self-pay | Admitting: Nurse Practitioner

## 2020-08-12 ENCOUNTER — Ambulatory Visit (INDEPENDENT_AMBULATORY_CARE_PROVIDER_SITE_OTHER): Payer: Medicare HMO | Admitting: Nurse Practitioner

## 2020-08-12 ENCOUNTER — Other Ambulatory Visit: Payer: Self-pay

## 2020-08-12 ENCOUNTER — Encounter: Payer: Self-pay | Admitting: Nurse Practitioner

## 2020-08-12 ENCOUNTER — Ambulatory Visit (INDEPENDENT_AMBULATORY_CARE_PROVIDER_SITE_OTHER): Payer: Medicare HMO

## 2020-08-12 VITALS — BP 116/67 | HR 74 | Temp 97.8°F | Resp 20 | Ht <= 58 in | Wt 118.0 lb

## 2020-08-12 DIAGNOSIS — E559 Vitamin D deficiency, unspecified: Secondary | ICD-10-CM

## 2020-08-12 DIAGNOSIS — M810 Age-related osteoporosis without current pathological fracture: Secondary | ICD-10-CM

## 2020-08-12 DIAGNOSIS — Z6825 Body mass index (BMI) 25.0-25.9, adult: Secondary | ICD-10-CM | POA: Diagnosis not present

## 2020-08-12 DIAGNOSIS — K219 Gastro-esophageal reflux disease without esophagitis: Secondary | ICD-10-CM

## 2020-08-12 DIAGNOSIS — E782 Mixed hyperlipidemia: Secondary | ICD-10-CM

## 2020-08-12 DIAGNOSIS — R2231 Localized swelling, mass and lump, right upper limb: Secondary | ICD-10-CM | POA: Diagnosis not present

## 2020-08-12 MED ORDER — ALENDRONATE SODIUM 70 MG PO TABS: ORAL_TABLET | ORAL | 1 refills | Status: DC

## 2020-08-12 MED ORDER — PRAVASTATIN SODIUM 40 MG PO TABS: 40.0000 mg | ORAL_TABLET | Freq: Every day | ORAL | 3 refills | Status: DC

## 2020-08-12 MED ORDER — FAMOTIDINE 20 MG PO TABS: 20.0000 mg | ORAL_TABLET | Freq: Two times a day (BID) | ORAL | 3 refills | Status: DC

## 2020-08-12 NOTE — Progress Notes (Addendum)
Subjective:    Patient ID: Rebecca Macdonald, female    DOB: 1945-11-05, 74 y.o.   MRN: [id number]    Chief Complaint: Medical Management of Chronic Issues    HPI:  1. Mixed hyperlipidemia Tries to watch diet but does no exercise. Lab Results  Component Value Date   CHOL 131 02/07/2020   HDL 41 02/07/2020   LDLCALC 74 02/07/2020   TRIG 84 02/07/2020   CHOLHDL 3.2 02/07/2020     2. Gastroesophageal reflux disease without esophagitis Is on pepcid daily and wrks well to keep symptoms under control   3. Vitamin D deficiency Is on a daily vitamin d supplement  4. Age-related osteoporosis without current pathological fracture She is currently on fosamax. Her last dexascan was done 07/04/18 with t score of -3.5. she does no weight bearing exercise.  5. BMI 25.0-25.9,adult No recent weight changes Wt Readings from Last 3 Encounters:  08/12/20 118 lb (53.5 kg)  02/07/20 121 lb (54.9 kg)  08/07/19 120 lb (54.4 kg)   BMI Readings from Last 3 Encounters:  08/12/20 24.66 kg/m  02/07/20 25.29 kg/m  08/07/19 25.08 kg/m       Outpatient Encounter Medications as of 08/12/2020  Medication Sig  . acetaminophen (TYLENOL) 500 MG tablet Take 500 mg by mouth daily as needed for moderate pain or headache.   . alendronate (FOSAMAX) 70 MG tablet Take 1 tablet (70 mg total) by mouth every Sunday. Take with a full glass of water onan empty stomach.  . Budeson-Glycopyrrol-Formoterol (BREZTRI AEROSPHERE) 160-9-4.8 MCG/ACT AERO Inhale 1 puff into the lungs daily.  . Calcium-Magnesium-Vitamin D (CALCIUM 1200+D3 PO) Take 1 tablet by mouth daily.  . Cholecalciferol (VITAMIN D) 50 MCG (2000 UT) tablet Take 2,000 Units by mouth daily.  . famotidine (PEPCID) 20 MG tablet Take 1 tablet (20 mg total) by mouth 2 (two) times daily.  Marland Kitchen loratadine (CLARITIN) 10 MG tablet Take 1 tablet (10 mg total) by mouth daily.  . pravastatin (PRAVACHOL) 40 MG tablet Take 1 tablet (40 mg total) by mouth daily.   No  facility-administered encounter medications on file as of 08/12/2020.    Past Surgical History:  Procedure Laterality Date  . CHOLECYSTECTOMY  2013  . COLONOSCOPY N/A 06/15/2016   Procedure: COLONOSCOPY;  Surgeon: Daneil Dolin, MD;  [facility];  Service: Endoscopy;  Laterality: N/A;  1215  . COLONOSCOPY N/A 09/26/2019   Procedure: COLONOSCOPY;  Surgeon: Daneil Dolin, MD;  [facility];  Service: Endoscopy;  Laterality: N/A;  1:00-office said pt can't come any earlier d/t transportation  . POLYPECTOMY  06/15/2016   Procedure: POLYPECTOMY;  Surgeon: Daneil Dolin, MD;  [facility];  Service: Endoscopy;;  colon  . POLYPECTOMY  09/26/2019   Procedure: POLYPECTOMY;  Surgeon: Daneil Dolin, MD;  [facility];  Service: Endoscopy;;    Family History  Problem Relation Age of Onset  . Colon polyps Brother        high grade required partial colectomy but not cancer.   . Colon cancer Neg Hx     New complaints: No complaints today  Social history: She lives by herself and her sister check son her daily  Controlled substance contract: n/a    Review of Systems  Constitutional: Negative for diaphoresis.  Eyes: Negative for pain.  Respiratory: Negative for shortness of breath.   Cardiovascular: Negative for chest pain, palpitations and leg swelling.  Gastrointestinal: Negative for abdominal pain.  Endocrine:  Negative for polydipsia.  Skin: Negative for rash.  Neurological: Negative for dizziness, weakness and headaches.  Hematological: Does not bruise/bleed easily.  All other systems reviewed and are negative.      Objective:   Physical Exam Vitals and nursing note reviewed.  Constitutional:      General: She is not in acute distress.    Appearance: Normal appearance. She is well-developed and well-nourished.  HENT:     Head: Normocephalic.     Nose: Nose normal.     Mouth/Throat:     Mouth: Oropharynx is clear and moist.   Eyes:     Extraocular Movements: EOM normal.     Pupils: Pupils are equal, round, and reactive to light.  Neck:     Vascular: No carotid bruit or JVD.  Cardiovascular:     Rate and Rhythm: Normal rate and regular rhythm.     Pulses: Intact distal pulses.     Heart sounds: Normal heart sounds.  Pulmonary:     Effort: Pulmonary effort is normal. No respiratory distress.     Breath sounds: Normal breath sounds. No wheezing or rales.  Chest:     Chest wall: No tenderness.  Breasts:     Right: Normal. Axillary adenopathy present.     Left: Normal.      Comments: 3cm nontender mass right axillia Abdominal:     General: Bowel sounds are normal. There is no distension or abdominal bruit. Aorta is normal.     Palpations: Abdomen is soft. There is no hepatomegaly, splenomegaly, mass or pulsatile mass.     Tenderness: There is no abdominal tenderness.  Musculoskeletal:        General: No edema. Normal range of motion.     Cervical back: Normal range of motion and neck supple.  Lymphadenopathy:     Cervical: No cervical adenopathy.     Upper Body:     Right upper body: Axillary adenopathy present.  Skin:    General: Skin is warm and dry.  Neurological:     Mental Status: She is alert and oriented to person, place, and time.     Deep Tendon Reflexes: Reflexes are normal and symmetric.  Psychiatric:        Mood and Affect: Mood and affect normal.        Behavior: Behavior normal.        Thought Content: Thought content normal.        Judgment: Judgment normal.    BP 116/67   Pulse 74   Temp 97.8 F (36.6 C) (Temporal)   Resp 20   Ht _0  (1.473 m)   Wt 118 lb (53.5 kg)   SpO2 97%   BMI 24.66 kg/m         Assessment & Plan:  Rebecca Macdonald comes in today with chief complaint of Medical Management of Chronic Issues   Diagnosis and orders addressed:  1. Mixed hyperlipidemia Low fat diet - pravastatin (PRAVACHOL) 40 MG tablet; Take 1 tablet (40 mg total) by mouth daily.   Dispense: 90 tablet; Refill: 3 - CBC with Differential/Platelet - CMP14+EGFR - Lipid panel  2. Gastroesophageal reflux disease without esophagitis Avoid spicy foods Do not eat 2 hours prior to bedtime - famotidine (PEPCID) 20 MG tablet; Take 1 tablet (20 mg total) by mouth 2 (two) times daily.  Dispense: 180 tablet; Refill: 3  3. Vitamin D deficiency Continue daily vitamind supplement  4. Age-related osteoporosis without current pathological fracture Weight bearing exercises -  alendronate (FOSAMAX) 70 MG tablet; Take 1 tablet (70 mg total) by mouth every Sunday. Take with a full glass of water onan empty stomach.  Dispense: 12 tablet; Refill: 1 - DG [facility] DEXA  5. BMI 25.0-25.9,adult Discussed diet and exercise for person with BMI >25 Will recheck weight in 3-6 months  6. Right axillary mass diag mammo ordered with U/S   Labs pending Health Maintenance reviewed Diet and exercise encouraged  Follow up plan: 6 months   [place]ana, FNP

## 2020-08-12 NOTE — Patient Instructions (Signed)

## 2020-08-12 NOTE — Addendum Note (Signed)
Addended by: Bennie Pierini on: 08/12/2020 11:20 AM   Modules accepted: Orders

## 2020-08-13 ENCOUNTER — Other Ambulatory Visit: Payer: Self-pay

## 2020-08-13 DIAGNOSIS — R2231 Localized swelling, mass and lump, right upper limb: Secondary | ICD-10-CM

## 2020-08-13 LAB — CMP14+EGFR
ALT: 22 IU/L (ref 0–32)
AST: 25 IU/L (ref 0–40)
Albumin/Globulin Ratio: 1.5 (ref 1.2–2.2)
Albumin: 4.1 g/dL (ref 3.7–4.7)
Alkaline Phosphatase: 53 IU/L (ref 44–121)
BUN/Creatinine Ratio: 13 (ref 12–28)
BUN: 10 mg/dL (ref 8–27)
Bilirubin Total: 0.4 mg/dL (ref 0.0–1.2)
CO2: 28 mmol/L (ref 20–29)
Calcium: 9.3 mg/dL (ref 8.7–10.3)
Chloride: 101 mmol/L (ref 96–106)
Creatinine, Ser: 0.78 mg/dL (ref 0.57–1.00)
GFR calc Af Amer: 88 mL/min/{1.73_m2} (ref >59)
GFR calc non Af Amer: 76 mL/min/{1.73_m2} (ref >59)
Globulin, Total: 2.8 g/dL (ref 1.5–4.5)
Glucose: 104 mg/dL — ABNORMAL HIGH (ref 65–99)
Potassium: 3.9 mmol/L (ref 3.5–5.2)
Sodium: 142 mmol/L (ref 134–144)
Total Protein: 6.9 g/dL (ref 6.0–8.5)

## 2020-08-13 LAB — LIPID PANEL
Chol/HDL Ratio: 3 ratio (ref 0.0–4.4)
Cholesterol, Total: 136 mg/dL (ref 100–199)
HDL: 45 mg/dL (ref >39)
LDL Chol Calc (NIH): 73 mg/dL (ref 0–99)
Triglycerides: 93 mg/dL (ref 0–149)
VLDL Cholesterol Cal: 18 mg/dL (ref 5–40)

## 2020-08-13 LAB — CBC WITH DIFFERENTIAL/PLATELET
Basophils Absolute: 0 10*3/uL (ref 0.0–0.2)
Basos: 1 %
EOS (ABSOLUTE): 0.1 10*3/uL (ref 0.0–0.4)
Eos: 1 %
Hematocrit: 43.1 % (ref 34.0–46.6)
Hemoglobin: 14.7 g/dL (ref 11.1–15.9)
Immature Grans (Abs): 0 10*3/uL (ref 0.0–0.1)
Immature Granulocytes: 0 %
Lymphocytes Absolute: 2.2 10*3/uL (ref 0.7–3.1)
Lymphs: 29 %
MCH: 30.7 pg (ref 26.6–33.0)
MCHC: 34.1 g/dL (ref 31.5–35.7)
MCV: 90 fL (ref 79–97)
Monocytes Absolute: 0.5 10*3/uL (ref 0.1–0.9)
Monocytes: 7 %
Neutrophils Absolute: 4.9 10*3/uL (ref 1.4–7.0)
Neutrophils: 62 %
Platelets: 204 10*3/uL (ref 150–450)
RBC: 4.79 x10E6/uL (ref 3.77–5.28)
RDW: 12.2 % (ref 11.7–15.4)
WBC: 7.8 10*3/uL (ref 3.4–10.8)

## 2020-08-14 DIAGNOSIS — M810 Age-related osteoporosis without current pathological fracture: Secondary | ICD-10-CM | POA: Diagnosis not present

## 2020-08-14 DIAGNOSIS — Z780 Asymptomatic menopausal state: Secondary | ICD-10-CM | POA: Diagnosis not present

## 2020-08-14 DIAGNOSIS — M8588 Other specified disorders of bone density and structure, other site: Secondary | ICD-10-CM | POA: Diagnosis not present

## 2020-08-28 ENCOUNTER — Other Ambulatory Visit: Payer: Self-pay

## 2020-08-28 ENCOUNTER — Ambulatory Visit (HOSPITAL_COMMUNITY)
Admission: RE | Admit: 2020-08-28 | Discharge: 2020-08-28 | Disposition: A | Discharge status: Home / Self Care | Payer: Medicare HMO | Source: Ambulatory Visit | Attending: Nurse Practitioner | Admitting: Nurse Practitioner

## 2020-08-28 DIAGNOSIS — N6489 Other specified disorders of breast: Secondary | ICD-10-CM | POA: Diagnosis not present

## 2020-08-28 DIAGNOSIS — N6331 Unspecified lump in axillary tail of the right breast: Secondary | ICD-10-CM | POA: Insufficient documentation

## 2020-08-28 DIAGNOSIS — R2231 Localized swelling, mass and lump, right upper limb: Secondary | ICD-10-CM

## 2020-08-28 DIAGNOSIS — R922 Inconclusive mammogram: Secondary | ICD-10-CM | POA: Diagnosis not present

## 2020-09-02 ENCOUNTER — Other Ambulatory Visit: Payer: Self-pay | Admitting: Nurse Practitioner

## 2020-09-02 DIAGNOSIS — R2231 Localized swelling, mass and lump, right upper limb: Secondary | ICD-10-CM

## 2020-09-09 ENCOUNTER — Other Ambulatory Visit (HOSPITAL_COMMUNITY): Payer: Self-pay | Admitting: Nurse Practitioner

## 2020-09-09 DIAGNOSIS — Z1231 Encounter for screening mammogram for malignant neoplasm of breast: Secondary | ICD-10-CM

## 2020-10-03 ENCOUNTER — Ambulatory Visit (HOSPITAL_COMMUNITY)
Admission: RE | Admit: 2020-10-03 | Discharge: 2020-10-03 | Disposition: A | Discharge status: Home / Self Care | Payer: Medicare HMO | Source: Ambulatory Visit | Attending: Nurse Practitioner | Admitting: Nurse Practitioner

## 2020-10-03 ENCOUNTER — Other Ambulatory Visit: Payer: Self-pay

## 2020-10-03 DIAGNOSIS — Z1231 Encounter for screening mammogram for malignant neoplasm of breast: Secondary | ICD-10-CM | POA: Diagnosis not present

## 2020-10-09 ENCOUNTER — Other Ambulatory Visit (HOSPITAL_COMMUNITY): Payer: Self-pay | Admitting: Nurse Practitioner

## 2020-10-09 DIAGNOSIS — R928 Other abnormal and inconclusive findings on diagnostic imaging of breast: Secondary | ICD-10-CM

## 2020-10-17 ENCOUNTER — Ambulatory Visit (HOSPITAL_COMMUNITY)
Admission: RE | Admit: 2020-10-17 | Discharge: 2020-10-17 | Disposition: A | Discharge status: Home / Self Care | Payer: Medicare HMO | Source: Ambulatory Visit | Attending: Nurse Practitioner | Admitting: Nurse Practitioner

## 2020-10-17 ENCOUNTER — Other Ambulatory Visit: Payer: Self-pay

## 2020-10-17 DIAGNOSIS — R928 Other abnormal and inconclusive findings on diagnostic imaging of breast: Secondary | ICD-10-CM

## 2021-02-12 ENCOUNTER — Encounter: Payer: Self-pay | Admitting: Nurse Practitioner

## 2021-02-12 ENCOUNTER — Other Ambulatory Visit: Payer: Self-pay

## 2021-02-12 ENCOUNTER — Ambulatory Visit (INDEPENDENT_AMBULATORY_CARE_PROVIDER_SITE_OTHER): Payer: Medicare HMO | Admitting: Nurse Practitioner

## 2021-02-12 VITALS — BP 125/79 | HR 62 | Temp 97.4°F | Resp 20 | Ht <= 58 in | Wt 122.0 lb

## 2021-02-12 DIAGNOSIS — K219 Gastro-esophageal reflux disease without esophagitis: Secondary | ICD-10-CM

## 2021-02-12 DIAGNOSIS — Z23 Encounter for immunization: Secondary | ICD-10-CM

## 2021-02-12 DIAGNOSIS — M810 Age-related osteoporosis without current pathological fracture: Secondary | ICD-10-CM

## 2021-02-12 DIAGNOSIS — Z6825 Body mass index (BMI) 25.0-25.9, adult: Secondary | ICD-10-CM

## 2021-02-12 DIAGNOSIS — J410 Simple chronic bronchitis: Secondary | ICD-10-CM | POA: Diagnosis not present

## 2021-02-12 DIAGNOSIS — E559 Vitamin D deficiency, unspecified: Secondary | ICD-10-CM

## 2021-02-12 DIAGNOSIS — E782 Mixed hyperlipidemia: Secondary | ICD-10-CM | POA: Diagnosis not present

## 2021-02-12 MED ORDER — BREZTRI AEROSPHERE 160-9-4.8 MCG/ACT IN AERO: 1.0000 | INHALATION_SPRAY | Freq: Every day | RESPIRATORY_TRACT | 11 refills | Status: DC

## 2021-02-12 MED ORDER — FAMOTIDINE 20 MG PO TABS: 20.0000 mg | ORAL_TABLET | Freq: Two times a day (BID) | ORAL | 3 refills | Status: DC

## 2021-02-12 MED ORDER — PRAVASTATIN SODIUM 40 MG PO TABS: 40.0000 mg | ORAL_TABLET | Freq: Every day | ORAL | 3 refills | Status: DC

## 2021-02-12 MED ORDER — ALENDRONATE SODIUM 70 MG PO TABS
ORAL_TABLET | ORAL | 1 refills | Status: DC
Start: 2021-02-12 — End: 2021-08-21

## 2021-02-12 NOTE — Patient Instructions (Signed)

## 2021-02-12 NOTE — Addendum Note (Signed)
Addended by: Rolena Infante on: 02/12/2021 04:12 PM   Modules accepted: Orders

## 2021-02-12 NOTE — Progress Notes (Signed)
Subjective:    Patient ID: Rebecca Macdonald, female    DOB: 06/13/1946, 75 y.o.   MRN: [id number]   Chief Complaint: Medical Management of Chronic Issues    HPI:  1. Mixed hyperlipidemia Does try to watch diet but does very little exercise. ] Lab Results  Component Value Date   CHOL 136 08/12/2020   HDL 45 08/12/2020   LDLCALC 73 08/12/2020   TRIG 93 08/12/2020   CHOLHDL 3.0 08/12/2020     2. Gastroesophageal reflux disease without esophagitis Is on pepcid daily and is doing well.  3. Vitamin D deficiency Is on daily vitamin d supplement  4. Age-related osteoporosis without current pathological fracture Last dexascan was done on 07/2820. T score was -3.5. she is on a weekly dose of fosamax without side effects. Does not do much weight bearing exercise.  5. BMI 25.0-25.9,adult Weight is up 4lbs. Wt Readings from Last 3 Encounters:  02/12/21 122 lb (55.3 kg)  08/12/20 118 lb (53.5 kg)  02/07/20 121 lb (54.9 kg)   BMI Readings from Last 3 Encounters:  02/12/21 25.50 kg/m  08/12/20 24.66 kg/m  02/07/20 25.29 kg/m   6. COPD Ran out or breztri and helps her to breathe better.   Outpatient Encounter Medications as of 02/12/2021  Medication Sig   acetaminophen (TYLENOL) 500 MG tablet Take 500 mg by mouth daily as needed for moderate pain or headache.    alendronate (FOSAMAX) 70 MG tablet Take 1 tablet (70 mg total) by mouth every Sunday. Take with a full glass of water onan empty stomach.   Budeson-Glycopyrrol-Formoterol (BREZTRI AEROSPHERE) 160-9-4.8 MCG/ACT AERO Inhale 1 puff into the lungs daily.   Calcium-Magnesium-Vitamin D (CALCIUM 1200+D3 PO) Take 1 tablet by mouth daily.   Cholecalciferol (VITAMIN D) 50 MCG (2000 UT) tablet Take 2,000 Units by mouth daily.   famotidine (PEPCID) 20 MG tablet Take 1 tablet (20 mg total) by mouth 2 (two) times daily.   loratadine (CLARITIN) 10 MG tablet Take 1 tablet (10 mg total) by mouth daily.   pravastatin (PRAVACHOL) 40 MG  tablet Take 1 tablet (40 mg total) by mouth daily.   No facility-administered encounter medications on file as of 02/12/2021.    Past Surgical History:  Procedure Laterality Date   CHOLECYSTECTOMY  2013   COLONOSCOPY N/A 06/15/2016   Procedure: COLONOSCOPY;  Surgeon: Corbin Ade, MD;  [facility];  Service: Endoscopy;  Laterality: N/A;  1215   COLONOSCOPY N/A 09/26/2019   Procedure: COLONOSCOPY;  Surgeon: Corbin Ade, MD;  [facility];  Service: Endoscopy;  Laterality: N/A;  1:00-office said pt can't come any earlier d/t transportation   POLYPECTOMY  06/15/2016   Procedure: POLYPECTOMY;  Surgeon: Corbin Ade, MD;  [facility];  Service: Endoscopy;;  colon   POLYPECTOMY  09/26/2019   Procedure: POLYPECTOMY;  Surgeon: Corbin Ade, MD;  [facility];  Service: Endoscopy;;    Family History  Problem Relation Age of Onset   Colon polyps Brother        high grade required partial colectomy but not cancer.    Colon cancer Neg Hx     New complaints: None today  Social history: Lives by herself  Controlled substance contract: n/a      Review of Systems  Constitutional:  Negative for diaphoresis.  Eyes:  Negative for pain.  Respiratory:  Negative for shortness of breath.   Cardiovascular:  Negative for chest pain, palpitations and leg  swelling.  Gastrointestinal:  Negative for abdominal pain.  Endocrine: Negative for polydipsia.  Skin:  Negative for rash.  Neurological:  Negative for dizziness, weakness and headaches.  Hematological:  Does not bruise/bleed easily.  All other systems reviewed and are negative.     Objective:   Physical Exam Vitals and nursing note reviewed.  Constitutional:      General: She is not in acute distress.    Appearance: Normal appearance. She is well-developed.  HENT:     Head: Normocephalic.     Right Ear: Tympanic membrane normal.     Left Ear: Tympanic membrane normal.     Nose:  Nose normal.     Mouth/Throat:     Mouth: Mucous membranes are moist.  Eyes:     Pupils: Pupils are equal, round, and reactive to light.  Neck:     Vascular: No carotid bruit or JVD.  Cardiovascular:     Rate and Rhythm: Normal rate and regular rhythm.     Heart sounds: Normal heart sounds.  Pulmonary:     Effort: Pulmonary effort is normal. No respiratory distress.     Breath sounds: Normal breath sounds. No wheezing or rales.  Chest:     Chest wall: No tenderness.  Abdominal:     General: Bowel sounds are normal. There is no distension or abdominal bruit.     Palpations: Abdomen is soft. There is no hepatomegaly, splenomegaly, mass or pulsatile mass.     Tenderness: There is no abdominal tenderness.  Musculoskeletal:        General: Normal range of motion.     Cervical back: Normal range of motion and neck supple.  Lymphadenopathy:     Cervical: No cervical adenopathy.  Skin:    General: Skin is warm and dry.  Neurological:     Mental Status: She is alert and oriented to person, place, and time.     Deep Tendon Reflexes: Reflexes are normal and symmetric.  Psychiatric:        Behavior: Behavior normal.        Thought Content: Thought content normal.        Judgment: Judgment normal.    BP 125/79   Pulse 62   Temp (!) 97.4 F (36.3 C) (Temporal)   Resp 20   Ht $R'4\' 10"'NR$  (1.473 m)   Wt 122 lb (55.3 kg)   BMI 25.50 kg/m        Assessment & Plan:  Rebecca Macdonald comes in today with chief complaint of Medical Management of Chronic Issues   Diagnosis and orders addressed:  1. Mixed hyperlipidemia Low fat diet - pravastatin (PRAVACHOL) 40 MG tablet; Take 1 tablet (40 mg total) by mouth daily.  Dispense: 90 tablet; Refill: 3  2. Gastroesophageal reflux disease without esophagitis Avoid scontinue daily vitamin d supplementpicy foods Do not eat 2 hours prior to bedtime  - famotidine (PEPCID) 20 MG tablet; Take 1 tablet (20 mg total) by mouth 2 (two) times daily.   Dispense: 180 tablet; Refill: 3  3. Vitamin D deficiency Continue daily vitamin d supplement  4. Age-related osteoporosis without current pathological fracture Weigth bearing exercises - alendronate (FOSAMAX) 70 MG tablet; Take 1 tablet (70 mg total) by mouth every Sunday. Take with a full glass of water onan empty stomach.  Dispense: 12 tablet; Refill: 1  5. BMI 25.0-25.9,adult Discussed diet and exercise for person with BMI >25 Will recheck weight in 3-6 months  6. Simple chronic bronchitis (HCC) Use inhaler  daily - Budeson-Glycopyrrol-Formoterol (BREZTRI AEROSPHERE) 160-9-4.8 MCG/ACT AERO; Inhale 1 puff into the lungs daily.  Dispense: 10.7 g; Refill: 11  Orders Placed This Encounter  Procedures   CBC with Differential/Platelet   CMP14+EGFR   Lipid panel    Labs pending Health Maintenance reviewed Diet and exercise encouraged  Follow up plan: 6 months   Mary-Margaret Hassell Done, FNP

## 2021-02-13 LAB — CBC WITH DIFFERENTIAL/PLATELET
Basophils Absolute: 0 10*3/uL (ref 0.0–0.2)
Basos: 0 %
EOS (ABSOLUTE): 0.1 10*3/uL (ref 0.0–0.4)
Eos: 1 %
Hematocrit: 40.7 % (ref 34.0–46.6)
Hemoglobin: 13.4 g/dL (ref 11.1–15.9)
Immature Grans (Abs): 0 10*3/uL (ref 0.0–0.1)
Immature Granulocytes: 0 %
Lymphocytes Absolute: 2.4 10*3/uL (ref 0.7–3.1)
Lymphs: 33 %
MCH: 30.5 pg (ref 26.6–33.0)
MCHC: 32.9 g/dL (ref 31.5–35.7)
MCV: 93 fL (ref 79–97)
Monocytes Absolute: 0.7 10*3/uL (ref 0.1–0.9)
Monocytes: 9 %
Neutrophils Absolute: 4.1 10*3/uL (ref 1.4–7.0)
Neutrophils: 57 %
Platelets: 164 10*3/uL (ref 150–450)
RBC: 4.4 x10E6/uL (ref 3.77–5.28)
RDW: 13.1 % (ref 11.7–15.4)
WBC: 7.3 10*3/uL (ref 3.4–10.8)

## 2021-02-13 LAB — CMP14+EGFR
ALT: 23 IU/L (ref 0–32)
AST: 27 IU/L (ref 0–40)
Albumin/Globulin Ratio: 1.8 (ref 1.2–2.2)
Albumin: 4.2 g/dL (ref 3.7–4.7)
Alkaline Phosphatase: 48 IU/L (ref 44–121)
BUN/Creatinine Ratio: 19 (ref 12–28)
BUN: 13 mg/dL (ref 8–27)
Bilirubin Total: 0.7 mg/dL (ref 0.0–1.2)
CO2: 28 mmol/L (ref 20–29)
Calcium: 9.2 mg/dL (ref 8.7–10.3)
Chloride: 102 mmol/L (ref 96–106)
Creatinine, Ser: 0.68 mg/dL (ref 0.57–1.00)
Globulin, Total: 2.4 g/dL (ref 1.5–4.5)
Glucose: 93 mg/dL (ref 65–99)
Potassium: 3.6 mmol/L (ref 3.5–5.2)
Sodium: 144 mmol/L (ref 134–144)
Total Protein: 6.6 g/dL (ref 6.0–8.5)
eGFR: 92 mL/min/{1.73_m2} (ref >59)

## 2021-02-13 LAB — LIPID PANEL
Chol/HDL Ratio: 4 ratio (ref 0.0–4.4)
Cholesterol, Total: 137 mg/dL (ref 100–199)
HDL: 34 mg/dL — ABNORMAL LOW (ref >39)
LDL Chol Calc (NIH): 71 mg/dL (ref 0–99)
Triglycerides: 187 mg/dL — ABNORMAL HIGH (ref 0–149)
VLDL Cholesterol Cal: 32 mg/dL (ref 5–40)

## 2021-02-24 IMAGING — MG DIGITAL DIAGNOSTIC BILAT W/ TOMO W/ CAD
6 of 10 series · 6 of 30 positions shown · non-contrast
Comparison: Previous exam(s).

CLINICAL DATA: Short-term follow-up for probably benign right
breast mass.

EXAM:
DIGITAL DIAGNOSTIC BILATERAL MAMMOGRAM WITH CAD AND TOMO
RIGHT BREAST ULTRASOUND

[R CC synth-2D]
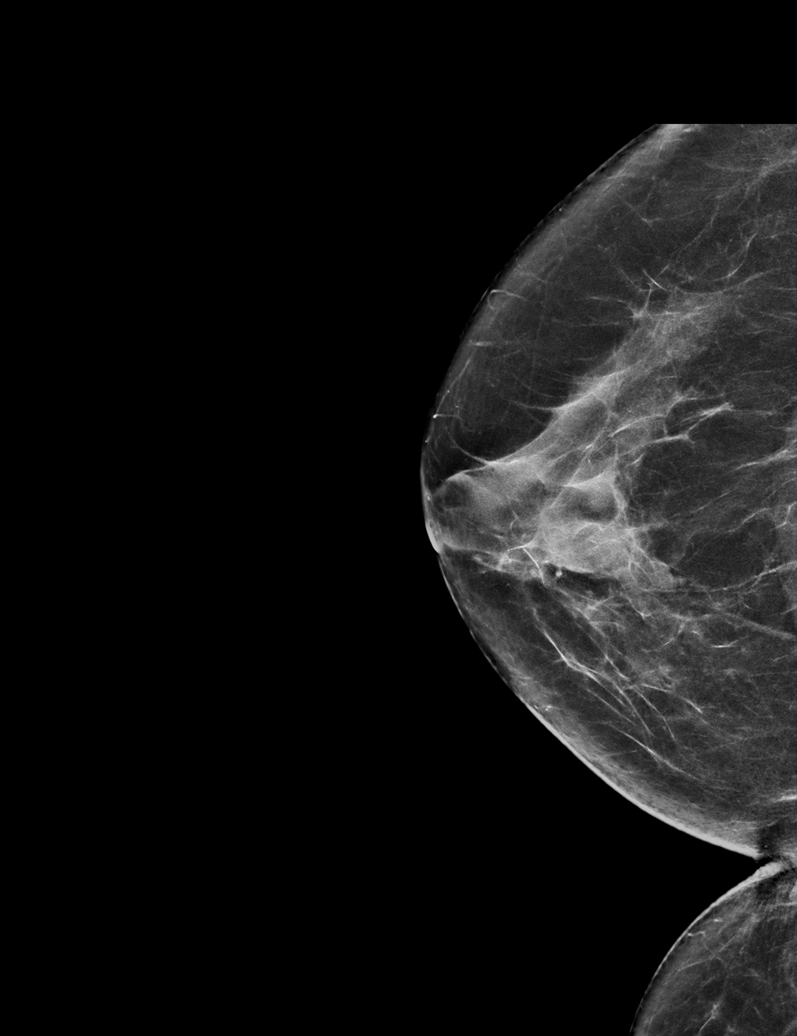

[R MLO synth-2D]
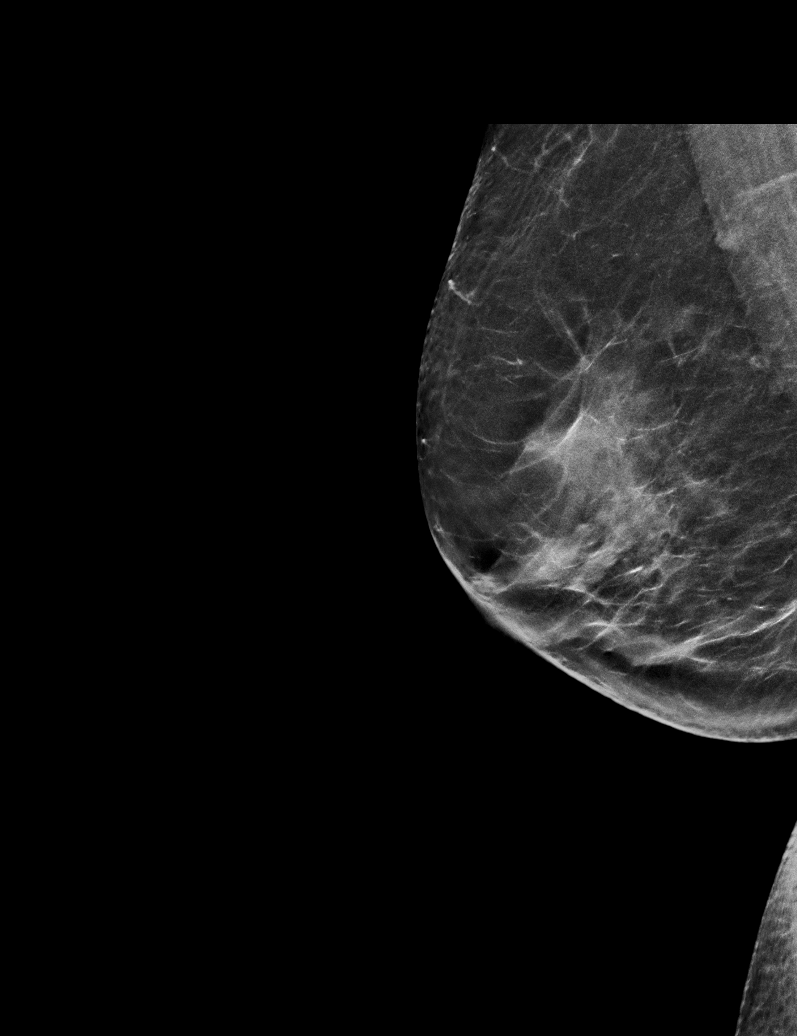

[L MLO synth-2D (1 of 2)]
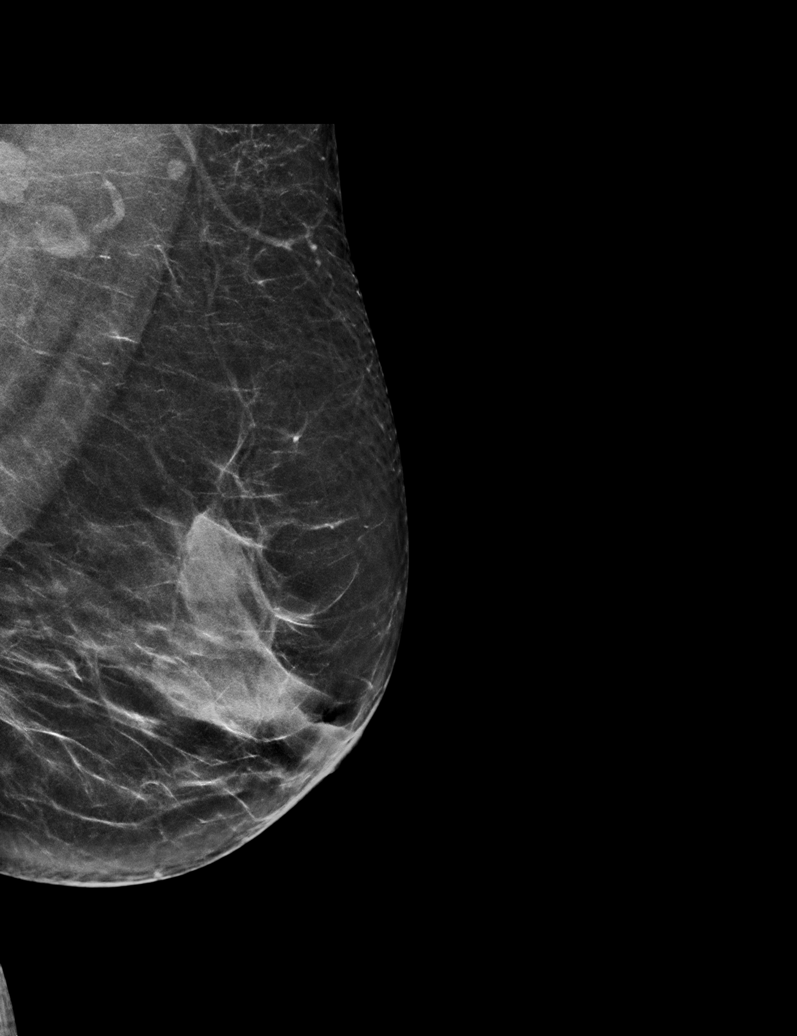

[L CC synth-2D]
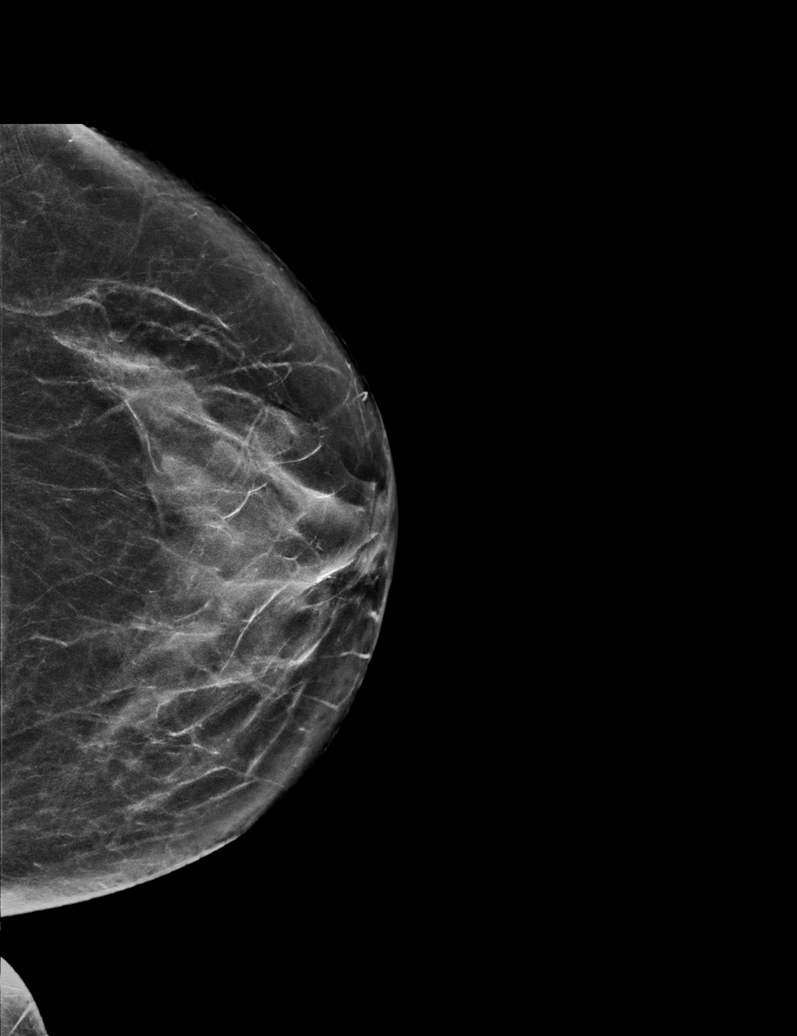

[L MLO synth-2D (2 of 2)]
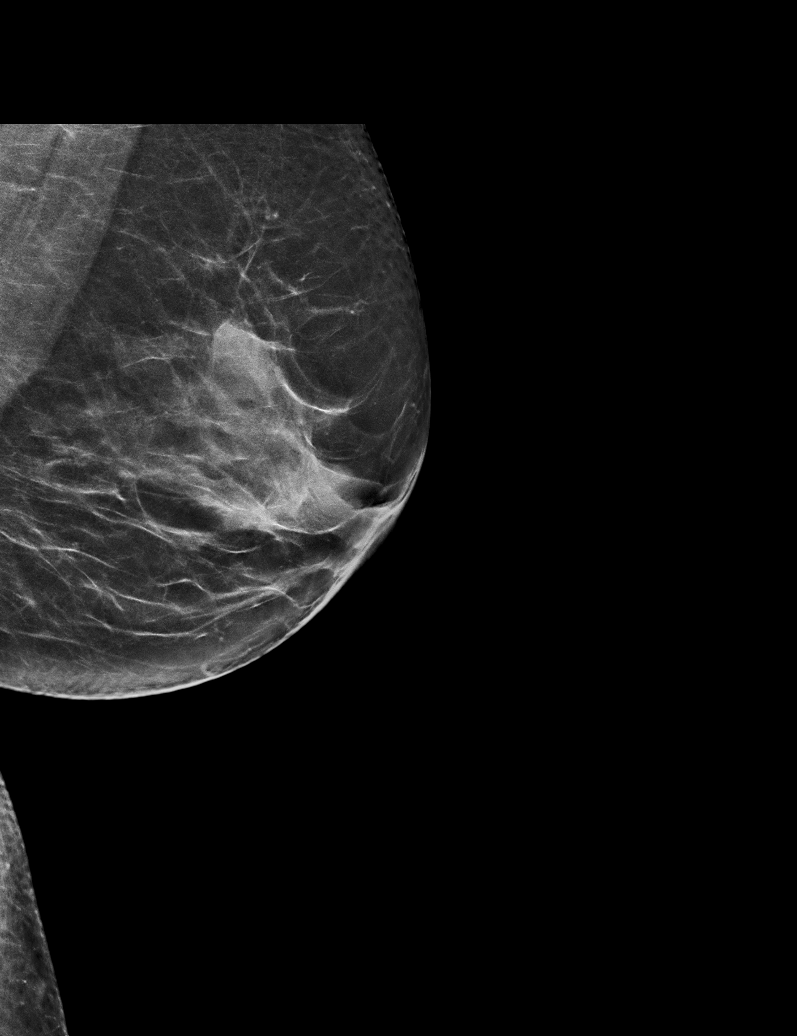

[R MLO tomo · tomo slice 31/60.0]
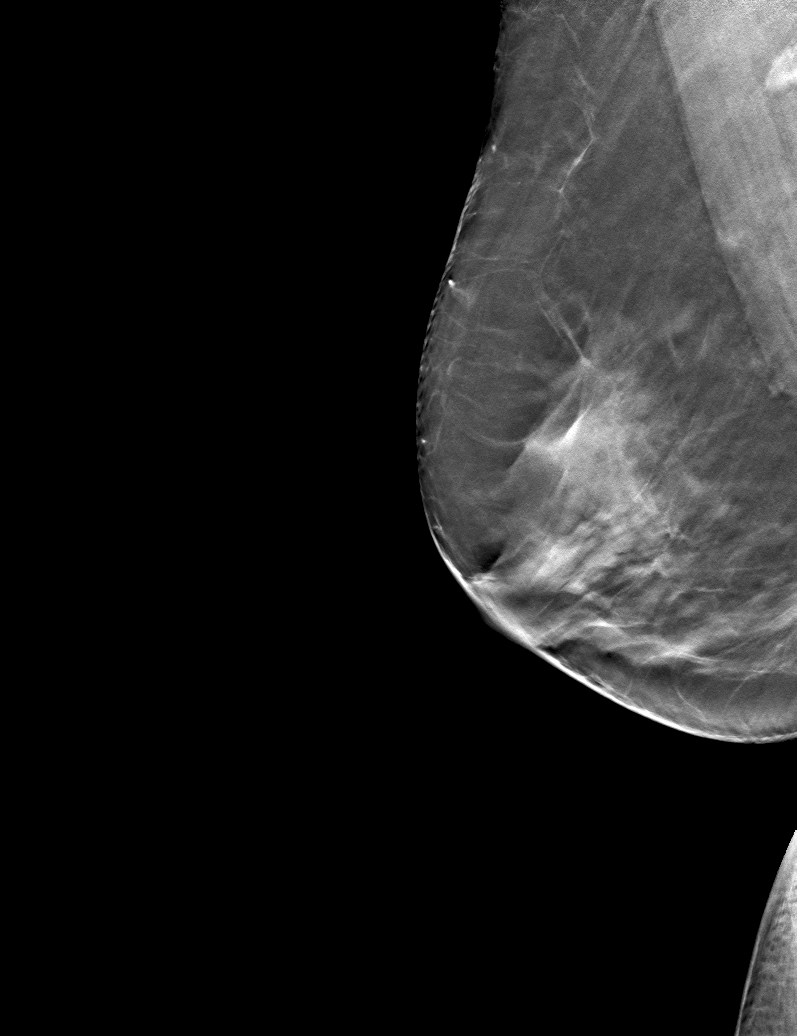

[6 of 30 positions shown; findings below may reference images not displayed]

ACR Breast Density Category c: The breast tissue is heterogeneously
dense, which may obscure small masses.
FINDINGS: No suspicious masses or calcifications are seen in either breast.
The oval mass with obscured margins within the inner right breast
appears stable. There is no mammographic evidence of malignancy in
either breast.

Mammographic images were processed with CAD.

Targeted ultrasound of the right breast was performed. The oval
circumscribed hypoechoic mass in the right breast at 1 o'clock 10 cm
from nipple measures 0.7 x 0.3 x 0.7 cm, unchanged in size and
appearance dating back to prior ultrasound dated 10/12/2016. This is
considered benign given greater than 2 years of stability.
IMPRESSION: No findings of malignancy in either breast.

RECOMMENDATION:
Screening mammogram in one year.(Code:W1-0-6IP)

I have discussed the findings and recommendations with the patient.
If applicable, a reminder letter will be sent to the patient
regarding the next appointment.

BI-RADS CATEGORY  2: Benign.

## 2021-06-09 DIAGNOSIS — H5203 Hypermetropia, bilateral: Secondary | ICD-10-CM | POA: Diagnosis not present

## 2021-07-30 ENCOUNTER — Telehealth: Payer: Self-pay | Admitting: Nurse Practitioner

## 2021-07-30 NOTE — Telephone Encounter (Signed)
Left message for patient to call back and schedule Medicare Annual Wellness Visit (AWV) to be completed by video or phone.   Last AWV: 02/05/2020  Please schedule at anytime with [place]  45 minute appointment  Any questions, please contact me at [phone]

## 2021-08-13 ENCOUNTER — Ambulatory Visit: Payer: Self-pay | Admitting: Nurse Practitioner

## 2021-08-18 ENCOUNTER — Telehealth: Payer: Self-pay | Admitting: Nurse Practitioner

## 2021-08-18 NOTE — Telephone Encounter (Signed)
One sample of Breztri placed up front for pt.  She will pick up today

## 2021-08-21 ENCOUNTER — Encounter: Payer: Self-pay | Admitting: Nurse Practitioner

## 2021-08-21 ENCOUNTER — Ambulatory Visit (INDEPENDENT_AMBULATORY_CARE_PROVIDER_SITE_OTHER): Payer: Medicare HMO | Admitting: Nurse Practitioner

## 2021-08-21 VITALS — BP 133/74 | HR 77 | Temp 97.8°F | Resp 20 | Ht <= 58 in | Wt 120.0 lb

## 2021-08-21 DIAGNOSIS — H8110 Benign paroxysmal vertigo, unspecified ear: Secondary | ICD-10-CM | POA: Diagnosis not present

## 2021-08-21 DIAGNOSIS — M810 Age-related osteoporosis without current pathological fracture: Secondary | ICD-10-CM | POA: Diagnosis not present

## 2021-08-21 DIAGNOSIS — K219 Gastro-esophageal reflux disease without esophagitis: Secondary | ICD-10-CM | POA: Diagnosis not present

## 2021-08-21 DIAGNOSIS — Z23 Encounter for immunization: Secondary | ICD-10-CM | POA: Diagnosis not present

## 2021-08-21 DIAGNOSIS — E559 Vitamin D deficiency, unspecified: Secondary | ICD-10-CM | POA: Diagnosis not present

## 2021-08-21 DIAGNOSIS — Z6825 Body mass index (BMI) 25.0-25.9, adult: Secondary | ICD-10-CM

## 2021-08-21 DIAGNOSIS — E782 Mixed hyperlipidemia: Secondary | ICD-10-CM

## 2021-08-21 DIAGNOSIS — J301 Allergic rhinitis due to pollen: Secondary | ICD-10-CM | POA: Diagnosis not present

## 2021-08-21 DIAGNOSIS — J410 Simple chronic bronchitis: Secondary | ICD-10-CM | POA: Diagnosis not present

## 2021-08-21 MED ORDER — FAMOTIDINE 20 MG PO TABS: 20.0000 mg | ORAL_TABLET | Freq: Two times a day (BID) | ORAL | 3 refills | Status: DC

## 2021-08-21 MED ORDER — BREZTRI AEROSPHERE 160-9-4.8 MCG/ACT IN AERO: 1.0000 | INHALATION_SPRAY | Freq: Every day | RESPIRATORY_TRACT | 11 refills | Status: DC

## 2021-08-21 MED ORDER — ALENDRONATE SODIUM 70 MG PO TABS: ORAL_TABLET | ORAL | 1 refills | Status: DC

## 2021-08-21 MED ORDER — LORATADINE 10 MG PO TABS
10.0000 mg | ORAL_TABLET | Freq: Every day | ORAL | 11 refills | Status: DC
Start: 2021-08-21 — End: 2022-11-01

## 2021-08-21 MED ORDER — PRAVASTATIN SODIUM 40 MG PO TABS: 40.0000 mg | ORAL_TABLET | Freq: Every day | ORAL | 3 refills | Status: DC

## 2021-08-21 NOTE — Progress Notes (Signed)
Subjective:    Patient ID: Rebecca Macdonald, female    DOB: 11-26-1945, 76 y.o.   MRN: [id number]   Chief Complaint: medical management of chronic issues     HPI:  Rebecca Macdonald is a 76 y.o. who identifies as a female who was assigned female at birth.   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 Mixed hyperlipidemia Does try  to watch diet but does no dedicated exercise. Lab Results  Component Value Date   CHOL 137 02/12/2021   HDL 34 (L) 02/12/2021   LDLCALC 71 02/12/2021   TRIG 187 (H) 02/12/2021   CHOLHDL 4.0 02/12/2021     2. Gastroesophageal reflux disease without esophagitis Is on pepcid daily and is doing well.  3. Benign paroxysmal positional vertigo, unspecified laterality Has gotten better  4. Vitamin D deficiency Is on daily vitamin d supplement  5. Age-related osteoporosis without current pathological fracture Last dexscan was done 08/15/20. Her t score was -3.5 is on fosamax weekly with no side effects.  6. BMI 25.0-25.9,adult No recent weight changes Wt Readings from Last 3 Encounters:  08/21/21 120 lb (54.4 kg)  02/12/21 122 lb (55.3 kg)  08/12/20 118 lb (53.5 kg)   BMI Readings from Last 3 Encounters:  08/21/21 25.08 kg/m  02/12/21 25.50 kg/m  08/12/20 24.66 kg/m      New complaints: None today  No Known Allergies Outpatient Encounter Medications as of 08/21/2021  Medication Sig   acetaminophen (TYLENOL) 500 MG tablet Take 500 mg by mouth daily as needed for moderate pain or headache.    alendronate (FOSAMAX) 70 MG tablet Take 1 tablet (70 mg total) by mouth every Sunday. Take with a full glass of water onan empty stomach.   Budeson-Glycopyrrol-Formoterol (BREZTRI AEROSPHERE) 160-9-4.8 MCG/ACT AERO Inhale 1 puff into the lungs daily.   Calcium-Magnesium-Vitamin D (CALCIUM 1200+D3 PO) Take 1 tablet by mouth daily.   Cholecalciferol (VITAMIN D) 50 MCG  (2000 UT) tablet Take 2,000 Units by mouth daily.   famotidine (PEPCID) 20 MG tablet Take 1 tablet (20 mg total) by mouth 2 (two) times daily.   loratadine (CLARITIN) 10 MG tablet Take 1 tablet (10 mg total) by mouth daily.   pravastatin (PRAVACHOL) 40 MG tablet Take 1 tablet (40 mg total) by mouth daily.   No facility-administered encounter medications on file as of 08/21/2021.    Past Surgical History:  Procedure Laterality Date   CHOLECYSTECTOMY  2013   COLONOSCOPY N/A 06/15/2016   Procedure: COLONOSCOPY;  Surgeon: Corbin Ade, MD;  [facility];  Service: Endoscopy;  Laterality: N/A;  1215   COLONOSCOPY N/A 09/26/2019   Procedure: COLONOSCOPY;  Surgeon: Corbin Ade, MD;  [facility];  Service: Endoscopy;  Laterality: N/A;  1:00-office said pt can't come any earlier d/t transportation   POLYPECTOMY  06/15/2016   Procedure: POLYPECTOMY;  Surgeon: Corbin Ade, MD;  [facility];  Service: Endoscopy;;  colon   POLYPECTOMY  09/26/2019   Procedure: POLYPECTOMY;  Surgeon: Corbin Ade, MD;  [facility];  Service: Endoscopy;;    Family History  Problem Relation Age of Onset   Colon polyps Brother        high grade required partial colectomy but not cancer.    Colon cancer Neg Hx       Controlled substance contract: n/a     Review  of Systems  Constitutional:  Negative for diaphoresis.  Eyes:  Negative for pain.  Respiratory:  Negative for shortness of breath.   Cardiovascular:  Negative for chest pain, palpitations and leg swelling.  Gastrointestinal:  Negative for abdominal pain.  Endocrine: Negative for polydipsia.  Skin:  Negative for rash.  Neurological:  Negative for dizziness, weakness and headaches.  Hematological:  Does not bruise/bleed easily.  All other systems reviewed and are negative.     Objective:   Physical Exam Vitals and nursing note reviewed.  Constitutional:      General: She is not in acute  distress.    Appearance: Normal appearance. She is well-developed.  HENT:     Head: Normocephalic.     Right Ear: Tympanic membrane normal.     Left Ear: Tympanic membrane normal.     Nose: Nose normal.     Mouth/Throat:     Mouth: Mucous membranes are moist.  Eyes:     Pupils: Pupils are equal, round, and reactive to light.  Neck:     Vascular: No carotid bruit or JVD.  Cardiovascular:     Rate and Rhythm: Normal rate and regular rhythm.     Heart sounds: Normal heart sounds.  Pulmonary:     Effort: Pulmonary effort is normal. No respiratory distress.     Breath sounds: Normal breath sounds. No wheezing or rales.  Chest:     Chest wall: No tenderness.  Abdominal:     General: Bowel sounds are normal. There is no distension or abdominal bruit.     Palpations: Abdomen is soft. There is no hepatomegaly, splenomegaly, mass or pulsatile mass.     Tenderness: There is no abdominal tenderness.  Musculoskeletal:        General: Normal range of motion.     Cervical back: Normal range of motion and neck supple.  Lymphadenopathy:     Cervical: No cervical adenopathy.  Skin:    General: Skin is warm and dry.  Neurological:     Mental Status: She is alert and oriented to person, place, and time.     Deep Tendon Reflexes: Reflexes are normal and symmetric.  Psychiatric:        Behavior: Behavior normal.        Thought Content: Thought content normal.        Judgment: Judgment normal.    BP 133/74    Pulse 77    Temp 97.8 F (36.6 C) (Temporal)    Resp 20    Ht $R'4\' 10"'yp$  (1.473 m)    Wt 120 lb (54.4 kg)    SpO2 95%    BMI 25.08 kg/m        Assessment & Plan:  Syrita Dovel comes in today with chief complaint of Medical Management of Chronic Issues (Swollen area to left elbow/)   Diagnosis and orders addressed:  1. Mixed hyperlipidemia Low fat diet - pravastatin (PRAVACHOL) 40 MG tablet; Take 1 tablet (40 mg total) by mouth daily.  Dispense: 90 tablet; Refill: 3 - CBC with  Differential/Platelet - CMP14+EGFR - Lipid panel  2. Gastroesophageal reflux disease without esophagitis Avoid spicy foods Do not eat 2 hours prior to bedtime - famotidine (PEPCID) 20 MG tablet; Take 1 tablet (20 mg total) by mouth 2 (two) times daily.  Dispense: 180 tablet; Refill: 3  3. Benign paroxysmal positional vertigo, unspecified laterality Rise slowly from laying to sitting and sitting to standing  4. Vitamin D deficiency Continue daily vitamin d supplement  5. Age-related osteoporosis without current pathological fracture Weight bearing exerises - alendronate (FOSAMAX) 70 MG tablet; Take 1 tablet (70 mg total) by mouth every Sunday. Take with a full glass of water onan empty stomach.  Dispense: 12 tablet; Refill: 1  6. BMI 25.0-25.9,adult Discussed diet and exercise for person with BMI >25 Will recheck weight in 3-6 months   7. Seasonal allergic rhinitis due to pollen - loratadine (CLARITIN) 10 MG tablet; Take 1 tablet (10 mg total) by mouth daily.  Dispense: 30 tablet; Refill: 11  8. Simple chronic bronchitis (HCC)  Budeson-Glycopyrrol-Formoterol (BREZTRI AEROSPHERE) 160-9-4.8 MCG/ACT AERO; Inhale 1 puff into the lungs daily.  Dispense: 10.7 g; Refill: 11   Labs pending Health Maintenance reviewed Diet and exercise encouraged  Follow up plan: prn   Mary-Margaret Hassell Done, FNP

## 2021-08-21 NOTE — Patient Instructions (Signed)

## 2021-08-21 NOTE — Progress Notes (Deleted)
° °  Subjective:    Patient ID: Rebecca Macdonald, female    DOB: [birth year], 76 y.o.   MRN: [id number]  HPI    Review of Systems     Objective:   Physical Exam        Assessment & Plan:

## 2021-08-22 LAB — CBC WITH DIFFERENTIAL/PLATELET
Basophils Absolute: 0 10*3/uL (ref 0.0–0.2)
Basos: 1 %
EOS (ABSOLUTE): 0 10*3/uL (ref 0.0–0.4)
Eos: 0 %
Hematocrit: 41.2 % (ref 34.0–46.6)
Hemoglobin: 14.4 g/dL (ref 11.1–15.9)
Immature Grans (Abs): 0 10*3/uL (ref 0.0–0.1)
Immature Granulocytes: 0 %
Lymphocytes Absolute: 2.4 10*3/uL (ref 0.7–3.1)
Lymphs: 34 %
MCH: 31.2 pg (ref 26.6–33.0)
MCHC: 35 g/dL (ref 31.5–35.7)
MCV: 89 fL (ref 79–97)
Monocytes Absolute: 0.5 10*3/uL (ref 0.1–0.9)
Monocytes: 7 %
Neutrophils Absolute: 4.1 10*3/uL (ref 1.4–7.0)
Neutrophils: 58 %
Platelets: 196 10*3/uL (ref 150–450)
RBC: 4.61 x10E6/uL (ref 3.77–5.28)
RDW: 12.5 % (ref 11.7–15.4)
WBC: 7 10*3/uL (ref 3.4–10.8)

## 2021-08-22 LAB — CMP14+EGFR
ALT: 19 IU/L (ref 0–32)
AST: 21 IU/L (ref 0–40)
Albumin/Globulin Ratio: 1.7 (ref 1.2–2.2)
Albumin: 4.3 g/dL (ref 3.7–4.7)
Alkaline Phosphatase: 54 IU/L (ref 44–121)
BUN/Creatinine Ratio: 17 (ref 12–28)
BUN: 13 mg/dL (ref 8–27)
Bilirubin Total: 0.4 mg/dL (ref 0.0–1.2)
CO2: 26 mmol/L (ref 20–29)
Calcium: 9.3 mg/dL (ref 8.7–10.3)
Chloride: 102 mmol/L (ref 96–106)
Creatinine, Ser: 0.76 mg/dL (ref 0.57–1.00)
Globulin, Total: 2.6 g/dL (ref 1.5–4.5)
Glucose: 101 mg/dL — ABNORMAL HIGH (ref 70–99)
Potassium: 3.6 mmol/L (ref 3.5–5.2)
Sodium: 142 mmol/L (ref 134–144)
Total Protein: 6.9 g/dL (ref 6.0–8.5)
eGFR: 83 mL/min/{1.73_m2} (ref >59)

## 2021-08-22 LAB — LIPID PANEL
Chol/HDL Ratio: 3 ratio (ref 0.0–4.4)
Cholesterol, Total: 133 mg/dL (ref 100–199)
HDL: 45 mg/dL (ref >39)
LDL Chol Calc (NIH): 73 mg/dL (ref 0–99)
Triglycerides: 76 mg/dL (ref 0–149)
VLDL Cholesterol Cal: 15 mg/dL (ref 5–40)

## 2021-08-31 ENCOUNTER — Ambulatory Visit (INDEPENDENT_AMBULATORY_CARE_PROVIDER_SITE_OTHER): Payer: Medicare HMO

## 2021-08-31 VITALS — Ht 59.0 in | Wt 120.0 lb

## 2021-08-31 DIAGNOSIS — Z0000 Encounter for general adult medical examination without abnormal findings: Secondary | ICD-10-CM | POA: Diagnosis not present

## 2021-08-31 NOTE — Progress Notes (Signed)
Subjective:   Rebecca Macdonald is a 76 y.o. female who presents for Medicare Annual (Subsequent) preventive examination. Virtual Visit via Telephone Note  I connected with  [name] on 08/31/21 at  8:15 AM EST by telephone and verified that I am speaking with the correct person using two identifiers.  Location: Patient: HOME Provider: [facility] Persons participating in the virtual visit: patient/Nurse Health Advisor   I discussed the limitations, risks, security and privacy concerns of performing an evaluation and management service by telephone and the availability of in person appointments. The patient expressed understanding and agreed to proceed.  Interactive audio and video telecommunications were attempted between this nurse and patient, however failed, due to patient having technical difficulties OR patient did not have access to video capability.  We continued and completed visit with audio only.  Some vital signs may be absent or patient reported.   Chriss Driver, LPN  Review of Systems     Cardiac Risk Factors include: advanced age (>52men, >74 women);dyslipidemia;sedentary lifestyle;Other (see comment), Risk factor comments: Osteoporosis, Vertigo  PHONE VISIT. PT AT HOME. NURSE AT [facility].    Objective:    Today's Vitals   08/31/21 0818  Weight: 120 lb (54.4 kg)  Height: 4\' 11"  (1.499 m)   Body mass index is 24.24 kg/m.  Advanced Directives 08/31/2021 02/05/2020 09/26/2019 01/26/2019 06/15/2016  Does Patient Have a Medical Advance Directive? No No No No No  Would patient like information on creating a medical advance directive? No - Patient declined No - Patient declined No - Patient declined Yes (MAU/Ambulatory/Procedural Areas - Information given) No - patient declined information    Current Medications (verified) Outpatient Encounter Medications as of 08/31/2021  Medication Sig   acetaminophen (TYLENOL) 500 MG tablet Take 500 mg by mouth daily as needed for moderate  pain or headache.    alendronate (FOSAMAX) 70 MG tablet Take 1 tablet (70 mg total) by mouth every Sunday. Take with a full glass of water onan empty stomach.   Budeson-Glycopyrrol-Formoterol (BREZTRI AEROSPHERE) 160-9-4.8 MCG/ACT AERO Inhale 1 puff into the lungs daily.   Calcium-Magnesium-Vitamin D (CALCIUM 1200+D3 PO) Take 1 tablet by mouth daily.   Cholecalciferol (VITAMIN D) 50 MCG (2000 UT) tablet Take 2,000 Units by mouth daily.   famotidine (PEPCID) 20 MG tablet Take 1 tablet (20 mg total) by mouth 2 (two) times daily.   loratadine (CLARITIN) 10 MG tablet Take 1 tablet (10 mg total) by mouth daily.   MODERNA COVID-19 VACCINE 100 MCG/0.5ML injection    pravastatin (PRAVACHOL) 40 MG tablet Take 1 tablet (40 mg total) by mouth daily.   No facility-administered encounter medications on file as of 08/31/2021.    Allergies (verified) Patient has no known allergies.   History: Past Medical History:  Diagnosis Date   Asthma    Cataract    Hypertension    Past Surgical History:  Procedure Laterality Date   CHOLECYSTECTOMY  2013   COLONOSCOPY N/A 06/15/2016   Procedure: COLONOSCOPY;  Surgeon: Daneil Dolin, MD;  [facility];  Service: Endoscopy;  Laterality: N/A;  1215   COLONOSCOPY N/A 09/26/2019   Procedure: COLONOSCOPY;  Surgeon: Daneil Dolin, MD;  [facility];  Service: Endoscopy;  Laterality: N/A;  1:00-office said pt can't come any earlier d/t transportation   POLYPECTOMY  06/15/2016   Procedure: POLYPECTOMY;  Surgeon: Daneil Dolin, MD;  [facility];  Service: Endoscopy;;  colon   POLYPECTOMY  09/26/2019  Procedure: POLYPECTOMY;  Surgeon: Daneil Dolin, MD;  [facility];  Service: Endoscopy;;   Family History  Problem Relation Age of Onset   Colon polyps Brother        high grade required partial colectomy but not cancer.    Colon cancer Neg Hx    Social History   Socioeconomic History   Marital status: Widowed     Spouse name: Not on file   Number of children: 1   Years of education: Not on file   Highest education level: 11th grade  Occupational History   Occupation: Retired  Tobacco Use   Smoking status: Former    Types: Cigarettes    Quit date: 03/13/2009    Years since quitting: 12.4   Smokeless tobacco: Never  Vaping Use   Vaping Use: Never used  Substance and Sexual Activity   Alcohol use: No   Drug use: No   Sexual activity: Yes  Other Topics Concern   Not on file  Social History Narrative   1 son, talks to or sees daily.   Social Determinants of Health   Financial Resource Strain: Low Risk    Difficulty of Paying Living Expenses: Not very hard  Food Insecurity: No Food Insecurity   Worried About Charity fundraiser in the Last Year: Never true   Ran Out of Food in the Last Year: Never true  Transportation Needs: No Transportation Needs   Lack of Transportation (Medical): No   Lack of Transportation (Non-Medical): No  Physical Activity: Insufficiently Active   Days of Exercise per Week: 2 days   Minutes of Exercise per Session: 30 min  Stress: No Stress Concern Present   Feeling of Stress : Not at all  Social Connections: Moderately Isolated   Frequency of Communication with Friends and Family: More than three times a week   Frequency of Social Gatherings with Friends and Family: More than three times a week   Attends Religious Services: 1 to 4 times per year   Active Member of [employer] or Organizations: No   Attends Archivist Meetings: Never   Marital Status: Widowed    Tobacco Counseling Counseling given: Not Answered   Clinical Intake:  Pre-visit preparation completed: Yes  Pain : No/denies pain     BMI - recorded: 24.24 Nutritional Status: BMI of 19-24  Normal Nutritional Risks: None Diabetes: No  How often do you need to have someone help you when you read instructions, pamphlets, or other written materials from your doctor or pharmacy?: 1 -  Never  Diabetic?NO  Interpreter Needed?: No  Information entered by :: MJ Mat Stuard, LPN   Activities of Daily Living In your present state of health, do you have any difficulty performing the following activities: 08/31/2021  Hearing? N  Vision? N  Difficulty concentrating or making decisions? N  Walking or climbing stairs? N  Dressing or bathing? N  Doing errands, shopping? N  Preparing Food and eating ? N  Using the Toilet? N  In the past six months, have you accidently leaked urine? N  Do you have problems with loss of bowel control? N  Managing your Medications? N  Managing your Finances? N  Housekeeping or managing your Housekeeping? N  Some recent data might be hidden    Patient Care Team: [name], FNP as PCP - General (Family Medicine) Gala Romney Cristopher Estimable, MD as Consulting Physician (Gastroenterology)  Indicate any recent Medical Services you may have received from other  than [facility] providers in the past year (date may be approximate).     Assessment:   This is a routine wellness examination for Jamal.  Hearing/Vision screen Hearing Screening - Comments:: No hearing issues.  Vision Screening - Comments:: Glasses. [facility]. 05/2021.  Dietary issues and exercise activities discussed: Current Exercise Habits: Home exercise routine, Type of exercise: walking, Time (Minutes): 20, Frequency (Times/Week): 2, Weekly Exercise (Minutes/Week): 40, Intensity: Mild, Exercise limited by: Other - see comments;cardiac condition(s) (Osteoporosis, Vertigo)   Goals Addressed             This Visit's Progress    DIET - INCREASE WATER INTAKE   On track    Exercise 3x per week (30 min per time)   Not on track    Try to exercise for at least 30 minutes, 3 times weekly     Increase physical activity   Not on track      Depression Screen PHQ 2/9 Scores 08/31/2021 08/21/2021 02/12/2021 08/12/2020 02/07/2020 02/05/2020 08/07/2019  PHQ - 2 Score 0 0 0 0 0 0 1  PHQ- 9  Score - 0 0 - - - -    Fall Risk Fall Risk  08/31/2021 08/21/2021 02/12/2021 08/12/2020 02/07/2020  Falls in the past year? 0 0 0 0 0  Number falls in past yr: 0 - - - -  Injury with Fall? 0 - - - -  Risk for fall due to : Other (Comment) - - - -  Risk for fall due to: Comment Osteoporosis - - - -  Follow up Falls prevention discussed - - - -    FALL RISK PREVENTION PERTAINING TO THE HOME:  Any stairs in or around the home? No  If so, are there any without handrails? No  Home free of loose throw rugs in walkways, pet beds, electrical cords, etc? Yes  Adequate lighting in your home to reduce risk of falls? Yes   ASSISTIVE DEVICES UTILIZED TO PREVENT FALLS:  Life alert? No  Use of a cane, walker or w/c? No  Grab bars in the bathroom? No  Shower chair or bench in shower? No  Elevated toilet seat or a handicapped toilet? No   TIMED UP AND GO:  Was the test performed? No .  Phone visit.   Cognitive Function: Normal cognitive status assessed by direct observation by this Nurse Health Advisor. No abnormalities found.  Pt declined repeat 6CIT.     6CIT Screen 02/05/2020 01/26/2019  What Year? 0 points 0 points  What month? 0 points 0 points  What time? 0 points 0 points  Count back from 20 0 points 2 points  Months in reverse 4 points 2 points  Repeat phrase 2 points 0 points  Total Score 6 4    Immunizations Immunization History  Administered Date(s) Administered   Fluad Quad(high Dose 65+) 05/02/2019, 06/11/2020, 08/21/2021   Influenza, High Dose Seasonal PF 07/26/2018   Influenza,inj,Quad PF,6+ Mos 06/21/2013, 06/25/2016, 07/01/2017   Moderna SARS-COV2 Booster Vaccination 04/14/2021   Moderna Sars-Covid-2 Vaccination 10/16/2019, 11/13/2019, 07/08/2020   Pneumococcal Conjugate-13 06/21/2013   Pneumococcal Polysaccharide-23 04/16/2016   Tdap 02/07/2020   Zoster Recombinat (Shingrix) 02/12/2021    TDAP status: Up to date  Flu Vaccine status: Up to date  Pneumococcal  vaccine status: Up to date  Covid-19 vaccine status: Completed vaccines  Qualifies for Shingles Vaccine? Yes   Zostavax completed Yes   Shingrix Completed?: No.    Education has been provided regarding the  importance of this vaccine. Patient has been advised to call insurance company to determine out of pocket expense if they have not yet received this vaccine. Advised may also receive vaccine at local pharmacy or Health Dept. Verbalized acceptance and understanding.  Screening Tests Health Maintenance  Topic Date Due   COVID-19 Vaccine (4 - Booster for Moderna series) 09/06/2021 (Originally 06/09/2021)   Zoster Vaccines- Shingrix (2 of 2) 11/19/2021 (Originally 04/09/2021)   MAMMOGRAM  10/03/2021   COLONOSCOPY (Pts 45-79yrs Insurance coverage will need to be confirmed)  09/25/2022   TETANUS/TDAP  02/06/2030   Pneumonia Vaccine 38+ Years old  Completed   INFLUENZA VACCINE  Completed   DEXA SCAN  Completed   Hepatitis C Screening  Completed   HPV VACCINES  Aged Out    Health Maintenance  There are no preventive care reminders to display for this patient.  Colorectal cancer screening: Type of screening: Colonoscopy. Completed 09/26/2019. Repeat every 3 years  Mammogram status: Completed 10/17/2020. Repeat every year  Bone Density status: Completed 08/12/2020. Results reflect: Bone density results: OSTEOPOROSIS. Repeat every 2 years.  Lung Cancer Screening: (Low Dose CT Chest recommended if Age 68-80 years, 30 pack-year currently smoking OR have quit w/in 15years.) does not qualify.    Additional Screening:  Hepatitis C Screening: does qualify; Completed 04/16/2016  Vision Screening: Recommended annual ophthalmology exams for early detection of glaucoma and other disorders of the eye. Is the patient up to date with their annual eye exam?  Yes  Who is the provider or what is the name of the office in which the patient attends annual eye exams? [facility] If pt is not  established with a provider, would they like to be referred to a provider to establish care? No .   Dental Screening: Recommended annual dental exams for proper oral hygiene  Community Resource Referral / Chronic Care Management: CRR required this visit?  No   CCM required this visit?  No      Plan:     I have personally reviewed and noted the following in the patients chart:   Medical and social history Use of alcohol, tobacco or illicit drugs  Current medications and supplements including opioid prescriptions.  Functional ability and status Nutritional status Physical activity Advanced directives List of other physicians Hospitalizations, surgeries, and ER visits in previous 12 months Vitals Screenings to include cognitive, depression, and falls Referrals and appointments  In addition, I have reviewed and discussed with patient certain preventive protocols, quality metrics, and best practice recommendations. A written personalized care plan for preventive services as well as general preventive health recommendations were provided to patient.     Chriss Driver, LPN   11/04/252   Nurse Notes: Pt is up to date with all health maintenance and vaccines. Pt declines 6CIT.

## 2021-08-31 NOTE — Patient Instructions (Signed)
Rebecca Macdonald , Thank you for taking time to come for your Medicare Wellness Visit. I appreciate your ongoing commitment to your health goals. Please review the following plan we discussed and let me know if I can assist you in the future.   Screening recommendations/referrals: Colonoscopy: Done 09/26/2019. Repeat every 3 years, due 09/25/2022. Mammogram: Done 10/17/2020 Repeat annually  Bone Density: Done 08/12/2020 Repeat every 2 years  Recommended yearly ophthalmology/optometry visit for glaucoma screening and checkup Recommended yearly dental visit for hygiene and checkup  Vaccinations: Influenza vaccine: Done 08/21/2021. Repeat annually  Pneumococcal vaccine: Done 06/21/2013 and 04/16/2016 Tdap vaccine: Done 02/04/2021. Repeat in 10 years  Shingles vaccine: Done 02/12/2021. Second dose due at your convenience.    Covid-19:Done 10/16/2019, 11/13/2019, 07/08/2020, 04/14/2021.  Advanced directives: Advance directive discussed with you today. Even though you declined this today, please call our office should you change your mind, and we can give you the proper paperwork for you to fill out.   Conditions/risks identified: Aim for 30 minutes of exercise or brisk walking each day, drink 6-8 glasses of water and eat lots of fruits and vegetables.   Next appointment: Follow up in one year for your annual wellness visit 2024.   Preventive Care 74 Years and Older, Female Preventive care refers to lifestyle choices and visits with your health care provider that can promote health and wellness. What does preventive care include? A yearly physical exam. This is also called an annual well check. Dental exams once or twice a year. Routine eye exams. Ask your health care provider how often you should have your eyes checked. Personal lifestyle choices, including: Daily care of your teeth and gums. Regular physical activity. Eating a healthy diet. Avoiding tobacco and drug use. Limiting alcohol  use. Practicing safe sex. Taking low-dose aspirin every day. Taking vitamin and mineral supplements as recommended by your health care provider. What happens during an annual well check? The services and screenings done by your health care provider during your annual well check will depend on your age, overall health, lifestyle risk factors, and family history of disease. Counseling  Your health care provider may ask you questions about your: Alcohol use. Tobacco use. Drug use. Emotional well-being. Home and relationship well-being. Sexual activity. Eating habits. History of falls. Memory and ability to understand (cognition). Work and work Statistician. Reproductive health. Screening  You may have the following tests or measurements: Height, weight, and BMI. Blood pressure. Lipid and cholesterol levels. These may be checked every 5 years, or more frequently if you are over 74 years old. Skin check. Lung cancer screening. You may have this screening every year starting at age 74 if you have a 30-pack-year history of smoking and currently smoke or have quit within the past 15 years. Fecal occult blood test (FOBT) of the stool. You may have this test every year starting at age 74. Flexible sigmoidoscopy or colonoscopy. You may have a sigmoidoscopy every 5 years or a colonoscopy every 10 years starting at age 74. Hepatitis C blood test. Hepatitis B blood test. Sexually transmitted disease (STD) testing. Diabetes screening. This is done by checking your blood sugar (glucose) after you have not eaten for a while (fasting). You may have this done every 1-3 years. Bone density scan. This is done to screen for osteoporosis. You may have this done starting at age 74. Mammogram. This may be done every 1-2 years. Talk to your health care provider about how often you should have regular mammograms. Talk  with your health care provider about your test results, treatment options, and if necessary,  the need for more tests. Vaccines  Your health care provider may recommend certain vaccines, such as: Influenza vaccine. This is recommended every year. Tetanus, diphtheria, and acellular pertussis (Tdap, Td) vaccine. You may need a Td booster every 10 years. Zoster vaccine. You may need this after age 33. Pneumococcal 13-valent conjugate (PCV13) vaccine. One dose is recommended after age 74. Pneumococcal polysaccharide (PPSV23) vaccine. One dose is recommended after age 65. Talk to your health care provider about which screenings and vaccines you need and how often you need them. This information is not intended to replace advice given to you by your health care provider. Make sure you discuss any questions you have with your health care provider. Document Released: 08/29/2015 Document Revised: 04/21/2016 Document Reviewed: 06/03/2015 Elsevier Interactive Patient Education  2017 [employer] Prevention in the Home Falls can cause injuries. They can happen to people of all ages. There are many things you can do to make your home safe and to help prevent falls. What can I do on the outside of my home? Regularly fix the edges of walkways and driveways and fix any cracks. Remove anything that might make you trip as you walk through a door, such as a raised step or threshold. Trim any bushes or trees on the path to your home. Use bright outdoor lighting. Clear any walking paths of anything that might make someone trip, such as rocks or tools. Regularly check to see if handrails are loose or broken. Make sure that both sides of any steps have handrails. Any raised decks and porches should have guardrails on the edges. Have any leaves, snow, or ice cleared regularly. Use sand or salt on walking paths during winter. Clean up any spills in your garage right away. This includes oil or grease spills. What can I do in the bathroom? Use night lights. Install grab bars by the toilet and in the  tub and shower. Do not use towel bars as grab bars. Use non-skid mats or decals in the tub or shower. If you need to sit down in the shower, use a plastic, non-slip stool. Keep the floor dry. Clean up any water that spills on the floor as soon as it happens. Remove soap buildup in the tub or shower regularly. Attach bath mats securely with double-sided non-slip rug tape. Do not have throw rugs and other things on the floor that can make you trip. What can I do in the bedroom? Use night lights. Make sure that you have a light by your bed that is easy to reach. Do not use any sheets or blankets that are too big for your bed. They should not hang down onto the floor. Have a firm chair that has side arms. You can use this for support while you get dressed. Do not have throw rugs and other things on the floor that can make you trip. What can I do in the kitchen? Clean up any spills right away. Avoid walking on wet floors. Keep items that you use a lot in easy-to-reach places. If you need to reach something above you, use a strong step stool that has a grab bar. Keep electrical cords out of the way. Do not use floor polish or wax that makes floors slippery. If you must use wax, use non-skid floor wax. Do not have throw rugs and other things on the floor that can make you  trip. What can I do with my stairs? Do not leave any items on the stairs. Make sure that there are handrails on both sides of the stairs and use them. Fix handrails that are broken or loose. Make sure that handrails are as long as the stairways. Check any carpeting to make sure that it is firmly attached to the stairs. Fix any carpet that is loose or worn. Avoid having throw rugs at the top or bottom of the stairs. If you do have throw rugs, attach them to the floor with carpet tape. Make sure that you have a light switch at the top of the stairs and the bottom of the stairs. If you do not have them, ask someone to add them for  you. What else can I do to help prevent falls? Wear shoes that: Do not have high heels. Have rubber bottoms. Are comfortable and fit you well. Are closed at the toe. Do not wear sandals. If you use a stepladder: Make sure that it is fully opened. Do not climb a closed stepladder. Make sure that both sides of the stepladder are locked into place. Ask someone to hold it for you, if possible. Clearly mark and make sure that you can see: Any grab bars or handrails. First and last steps. Where the edge of each step is. Use tools that help you move around (mobility aids) if they are needed. These include: Canes. Walkers. Scooters. Crutches. Turn on the lights when you go into a dark area. Replace any light bulbs as soon as they burn out. Set up your furniture so you have a clear path. Avoid moving your furniture around. If any of your floors are uneven, fix them. If there are any pets around you, be aware of where they are. Review your medicines with your doctor. Some medicines can make you feel dizzy. This can increase your chance of falling. Ask your doctor what other things that you can do to help prevent falls. This information is not intended to replace advice given to you by your health care provider. Make sure you discuss any questions you have with your health care provider. Document Released: 05/29/2009 Document Revised: 01/08/2016 Document Reviewed: 09/06/2014 Elsevier Interactive Patient Education  2017 [employer].

## 2021-09-23 ENCOUNTER — Ambulatory Visit (INDEPENDENT_AMBULATORY_CARE_PROVIDER_SITE_OTHER): Payer: Medicare HMO | Admitting: *Deleted

## 2021-09-23 DIAGNOSIS — Z23 Encounter for immunization: Secondary | ICD-10-CM

## 2021-09-23 NOTE — Progress Notes (Signed)
Patient in today for second Shingrix vaccine. Administered IM in left deltoid. Patient tolerated well.

## 2021-10-13 ENCOUNTER — Other Ambulatory Visit (HOSPITAL_COMMUNITY): Payer: Self-pay | Admitting: Nurse Practitioner

## 2021-10-13 DIAGNOSIS — Z1231 Encounter for screening mammogram for malignant neoplasm of breast: Secondary | ICD-10-CM

## 2021-10-21 ENCOUNTER — Ambulatory Visit (HOSPITAL_COMMUNITY)
Admission: RE | Admit: 2021-10-21 | Discharge: 2021-10-21 | Disposition: A | Discharge status: Home / Self Care | Payer: Medicare HMO | Source: Ambulatory Visit | Attending: Nurse Practitioner | Admitting: Nurse Practitioner

## 2021-10-21 ENCOUNTER — Other Ambulatory Visit: Payer: Self-pay

## 2021-10-21 DIAGNOSIS — Z1231 Encounter for screening mammogram for malignant neoplasm of breast: Secondary | ICD-10-CM | POA: Insufficient documentation

## 2022-02-19 ENCOUNTER — Ambulatory Visit (INDEPENDENT_AMBULATORY_CARE_PROVIDER_SITE_OTHER): Payer: Medicare HMO | Admitting: Nurse Practitioner

## 2022-02-19 ENCOUNTER — Encounter: Payer: Self-pay | Admitting: Nurse Practitioner

## 2022-02-19 VITALS — BP 116/67 | HR 83 | Temp 96.3°F | Resp 20 | Ht 59.0 in | Wt 120.0 lb

## 2022-02-19 DIAGNOSIS — K219 Gastro-esophageal reflux disease without esophagitis: Secondary | ICD-10-CM | POA: Diagnosis not present

## 2022-02-19 DIAGNOSIS — H8110 Benign paroxysmal vertigo, unspecified ear: Secondary | ICD-10-CM

## 2022-02-19 DIAGNOSIS — M810 Age-related osteoporosis without current pathological fracture: Secondary | ICD-10-CM

## 2022-02-19 DIAGNOSIS — Z6825 Body mass index (BMI) 25.0-25.9, adult: Secondary | ICD-10-CM | POA: Diagnosis not present

## 2022-02-19 DIAGNOSIS — E782 Mixed hyperlipidemia: Secondary | ICD-10-CM

## 2022-02-19 DIAGNOSIS — E559 Vitamin D deficiency, unspecified: Secondary | ICD-10-CM

## 2022-02-19 DIAGNOSIS — J410 Simple chronic bronchitis: Secondary | ICD-10-CM

## 2022-02-19 MED ORDER — ALENDRONATE SODIUM 70 MG PO TABS: ORAL_TABLET | ORAL | 1 refills | Status: DC

## 2022-02-19 MED ORDER — BREZTRI AEROSPHERE 160-9-4.8 MCG/ACT IN AERO: 1.0000 | INHALATION_SPRAY | Freq: Every day | RESPIRATORY_TRACT | 11 refills | Status: DC

## 2022-02-19 MED ORDER — PRAVASTATIN SODIUM 40 MG PO TABS
40.0000 mg | ORAL_TABLET | Freq: Every day | ORAL | 3 refills | Status: DC
Start: 2022-02-19 — End: 2022-09-02

## 2022-02-19 MED ORDER — FAMOTIDINE 20 MG PO TABS: 20.0000 mg | ORAL_TABLET | Freq: Two times a day (BID) | ORAL | 3 refills | Status: DC

## 2022-02-19 NOTE — Patient Instructions (Signed)
Bone Health Bones protect organs, store calcium, anchor muscles, and support the whole body. Keeping your bones strong is important, especially as you get older. You can take actions to help keep your bones strong and healthy. Why is keeping my bones healthy important?  Keeping your bones healthy is important because your body constantly replaces bone cells. Cells get old, and new cells take their place. As we age, we lose bone cells because the body may not be able to make enough new cells to replace the old cells. The amount of bone cells and bone tissue you have is referred to as bone mass. The higher your bone mass, the stronger your bones. The aging process leads to an overall loss of bone mass in the body, which can increase the likelihood of: Broken bones. A condition in which the bones become weak and brittle (osteoporosis). A large decline in bone mass occurs in older adults. In women, it occurs about the time of menopause. What actions can I take to keep my bones healthy? Good health habits are important for maintaining healthy bones. This includes eating nutritious foods and exercising regularly. To have healthy bones, you need to get enough of the right minerals and vitamins. Most nutrition experts recommend getting these nutrients from the foods that you eat. In some cases, taking supplements may also be recommended. Doing certain types of exercise is also important for bone health. What are the nutritional recommendations for healthy bones?  Eating a well-balanced diet with plenty of calcium and vitamin D will help to protect your bones. Nutritional recommendations vary from person to person. Ask your health care provider what is healthy for you. Here are some general guidelines. Get enough calcium Calcium is the most important (essential) mineral for bone health. Most people can get enough calcium from their diet, but supplements may be recommended for people who are at risk for  osteoporosis. Good sources of calcium include: Dairy products, such as low-fat or nonfat milk, cheese, and yogurt. Dark green leafy vegetables, such as bok choy and broccoli. Foods that have calcium added to them (are fortified). Foods that may be fortified with calcium include orange juice, cereal, bread, soy beverages, and tofu products. Nuts, such as almonds. Follow these recommended amounts for daily calcium intake: Infants, 0-6 months: 200 mg. Infants, 6-12 months: 260 mg. Children, age 1-3: 700 mg. Children, age 4-8: 1,000 mg. Children, age 9-13: 1,300 mg. Teens, age 14-18: 1,300 mg. Adults, age 19-50: 1,000 mg. Adults, age 51-70: Men: 1,000 mg. Women: 1,200 mg. Adults, age 71 or older: 1,200 mg. Pregnant and breastfeeding females: Teens: 1,300 mg. Adults: 1,000 mg. Get enough vitamin D Vitamin D is the most essential vitamin for bone health. It helps the body absorb calcium. Sunlight stimulates the skin to make vitamin D, so be sure to get enough sunlight. If you live in a cold climate or you do not get outside often, your health care provider may recommend that you take vitamin D supplements. Good sources of vitamin D in your diet include: Egg yolks. Saltwater fish. Milk and cereal fortified with vitamin D. Follow these recommended amounts for daily vitamin D intake: Infants, 0-12 months: 400 international units (IU). Children and teens, age 1-18: 600 international units. Adults, age 59 or younger: 600 international units. Adults, age 60 or older: 600-1,000 international units. Get other important nutrients Other nutrients that are important for bone health include: Phosphorus. This mineral is found in meat, poultry, dairy foods, nuts, and legumes. The   recommended daily intake for adult men and adult women is 700 mg. Magnesium. This mineral is found in seeds, nuts, dark green vegetables, and legumes. The recommended daily intake for adult men is 400-420 mg. For adult women,  it is 310-320 mg. Vitamin K. This vitamin is found in green leafy vegetables. The recommended daily intake is 120 mcg for adult men and 90 mcg for adult women. What type of physical activity is best for building and maintaining healthy bones? Weight-bearing and strength-building activities are important for building and maintaining healthy bones. Weight-bearing activities cause muscles and bones to work against gravity. Strength-building activities increase the strength of the muscles that support bones. Weight-bearing and muscle-building activities include: Walking and hiking. Jogging and running. Dancing. Gym exercises. Lifting weights. Tennis and racquetball. Climbing stairs. Aerobics. Adults should get at least 30 minutes of moderate physical activity on most days. Children should get at least 60 minutes of moderate physical activity on most days. Ask your health care provider what type of exercise is best for you. How can I find out if my bone mass is low? Bone mass can be measured with an X-ray test called a bone mineral density (BMD) test. This test is recommended for all women who are age 65 or older. It may also be recommended for: Men who are age 70 or older. People who are at risk for osteoporosis because of: Having a long-term disease that weakens bones, such as kidney disease or rheumatoid arthritis. Having menopause earlier than normal. Taking medicine that weakens bones, such as steroids, thyroid hormones, or hormone treatment for breast cancer or prostate cancer. Smoking. Drinking three or more alcoholic drinks a day. Being underweight. Sedentary lifestyle. If you find that you have a low bone mass, you may be able to prevent osteoporosis or further bone loss by changing your diet and lifestyle. Where can I find more information? Bone Health & Osteoporosis Foundation: www.nof.org/patients National Institutes of Health: www.bones.nih.gov International Osteoporosis  Foundation: www.iofbonehealth.org Summary The aging process leads to an overall loss of bone mass in the body, which can increase the likelihood of broken bones and osteoporosis. Eating a well-balanced diet with plenty of calcium and vitamin D will help to protect your bones. Weight-bearing and strength-building activities are also important for building and maintaining strong bones. Bone mass can be measured with an X-ray test called a bone mineral density (BMD) test. This information is not intended to replace advice given to you by your health care provider. Make sure you discuss any questions you have with your health care provider. Document Revised: 01/14/2021 Document Reviewed: 01/14/2021 Elsevier Patient Education  2023 Elsevier Inc.  

## 2022-02-19 NOTE — Progress Notes (Signed)
Subjective:    Patient ID: Rebecca Macdonald, female    DOB: [birth year], 76 y.o.   MRN: [id number]   Chief Complaint: medical management of chronic issues     HPI:  Rebecca Macdonald is a 76 y.o. who identifies as a female who was assigned female at birth.   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 Mixed hyperlipidemia Does not watch diet and does no dedicated exercise. Lab Results  Component Value Date   CHOL 133 08/21/2021   HDL 45 08/21/2021   LDLCALC 73 08/21/2021   TRIG 76 08/21/2021   CHOLHDL 3.0 08/21/2021  The 10-year ASCVD risk score (Arnett DK, et al., 2019) is: 10.3%    2. Gastroesophageal reflux disease without esophagitis Is on pepcid daily and is doing well.  3. Benign paroxysmal positional vertigo, unspecified laterality Has not had any recent symptoms of dizziness.  4. Age-related osteoporosis without current pathological fracture Last dexascan was done on 08/15/20. Her t score was -1.5. she doe sno weigth bearing exercises  5. Vitamin D deficiency Is on daily vitamin d supplement  6. BMI 25.0-25.9,adult No recent weight changes Wt Readings from Last 3 Encounters:  02/19/22 120 lb (54.4 kg)  08/31/21 120 lb (54.4 kg)  08/21/21 120 lb (54.4 kg)   BMI Readings from Last 3 Encounters:  02/19/22 24.24 kg/m  08/31/21 24.24 kg/m  08/21/21 25.08 kg/m      New complaints: None today  No Known Allergies Outpatient Encounter Medications as of 02/19/2022  Medication Sig   acetaminophen (TYLENOL) 500 MG tablet Take 500 mg by mouth daily as needed for moderate pain or headache.    alendronate (FOSAMAX) 70 MG tablet Take 1 tablet (70 mg total) by mouth every Sunday. Take with a full glass of water onan empty stomach.   Budeson-Glycopyrrol-Formoterol (BREZTRI AEROSPHERE) 160-9-4.8 MCG/ACT AERO Inhale 1 puff into the lungs daily.   Calcium-Magnesium-Vitamin D (CALCIUM  1200+D3 PO) Take 1 tablet by mouth daily.   Cholecalciferol (VITAMIN D) 50 MCG (2000 UT) tablet Take 2,000 Units by mouth daily.   famotidine (PEPCID) 20 MG tablet Take 1 tablet (20 mg total) by mouth 2 (two) times daily.   loratadine (CLARITIN) 10 MG tablet Take 1 tablet (10 mg total) by mouth daily.   MODERNA COVID-19 VACCINE 100 MCG/0.5ML injection    pravastatin (PRAVACHOL) 40 MG tablet Take 1 tablet (40 mg total) by mouth daily.   No facility-administered encounter medications on file as of 02/19/2022.    Past Surgical History:  Procedure Laterality Date   CHOLECYSTECTOMY  2013   COLONOSCOPY N/A 06/15/2016   Procedure: COLONOSCOPY;  Surgeon: Corbin Ade, MD;  Location: [facility];  Service: Endoscopy;  Laterality: N/A;  1215   COLONOSCOPY N/A 09/26/2019   Procedure: COLONOSCOPY;  Surgeon: Corbin Ade, MD;  Location: [facility];  Service: Endoscopy;  Laterality: N/A;  1:00-office said pt can't come any earlier d/t transportation   POLYPECTOMY  06/15/2016   Procedure: POLYPECTOMY;  Surgeon: Corbin Ade, MD;  Location: [facility];  Service: Endoscopy;;  colon   POLYPECTOMY  09/26/2019   Procedure: POLYPECTOMY;  Surgeon: Corbin Ade, MD;  Location: [facility];  Service: Endoscopy;;    Family History  Problem Relation Age of Onset   Colon polyps Brother        high grade required partial colectomy but not cancer.  Colon cancer Neg Hx       Controlled substance contract: n/a     Review of Systems  Constitutional:  Negative for diaphoresis.  Eyes:  Negative for pain.  Respiratory:  Negative for shortness of breath.   Cardiovascular:  Negative for chest pain, palpitations and leg swelling.  Gastrointestinal:  Negative for abdominal pain.  Endocrine: Negative for polydipsia.  Skin:  Negative for rash.  Neurological:  Negative for dizziness, weakness and headaches.  Hematological:  Does not bruise/bleed easily.  All other systems reviewed and are  negative.      Objective:   Physical Exam Vitals and nursing note reviewed.  Constitutional:      General: She is not in acute distress.    Appearance: Normal appearance. She is well-developed.  HENT:     Head: Normocephalic.     Right Ear: Tympanic membrane normal.     Left Ear: Tympanic membrane normal.     Nose: Nose normal.     Mouth/Throat:     Mouth: Mucous membranes are moist.  Eyes:     Pupils: Pupils are equal, round, and reactive to light.  Neck:     Vascular: No carotid bruit or JVD.  Cardiovascular:     Rate and Rhythm: Normal rate and regular rhythm.     Heart sounds: Normal heart sounds.  Pulmonary:     Effort: Pulmonary effort is normal. No respiratory distress.     Breath sounds: Normal breath sounds. No wheezing or rales.  Chest:     Chest wall: No tenderness.  Abdominal:     General: Bowel sounds are normal. There is no distension or abdominal bruit.     Palpations: Abdomen is soft. There is no hepatomegaly, splenomegaly, mass or pulsatile mass.     Tenderness: There is no abdominal tenderness.     Hernia: A hernia (very small umbilical hernia) is present.  Musculoskeletal:        General: Normal range of motion.     Cervical back: Normal range of motion and neck supple.  Lymphadenopathy:     Cervical: No cervical adenopathy.  Skin:    General: Skin is warm and dry.  Neurological:     Mental Status: She is alert and oriented to person, place, and time.     Deep Tendon Reflexes: Reflexes are normal and symmetric.  Psychiatric:        Behavior: Behavior normal.        Thought Content: Thought content normal.        Judgment: Judgment normal.    BP 116/67   Pulse 83   Temp (!) 96.3 F (35.7 C) (Temporal)   Resp 20   Ht $R'4\' 11"'AT$  (1.499 m)   Wt 120 lb (54.4 kg)   SpO2 97%   BMI 24.24 kg/m         Assessment & Plan:   Rebecca Macdonald comes in today with chief complaint of Medical Management of Chronic Issues   Diagnosis and orders  addressed:  1. Mixed hyperlipidemia Low fat diet - pravastatin (PRAVACHOL) 40 MG tablet; Take 1 tablet (40 mg total) by mouth daily.  Dispense: 90 tablet; Refill: 3 - CBC with Differential/Platelet - CMP14+EGFR - Lipid panel  2. Gastroesophageal reflux disease without esophagitis Avoid spicy foods Do not eat 2 hours prior to bedtime - famotidine (PEPCID) 20 MG tablet; Take 1 tablet (20 mg total) by mouth 2 (two) times daily.  Dispense: 180 tablet; Refill: 3  3. Benign paroxysmal  positional vertigo, unspecified laterality  4. Age-related osteoporosis without current pathological fracture Weight bearing exercises encouraged - alendronate (FOSAMAX) 70 MG tablet; Take 1 tablet (70 mg total) by mouth every Sunday. Take with a full glass of water onan empty stomach.  Dispense: 12 tablet; Refill: 1  5. Vitamin D deficiency Daily vitamin d supplement  6. BMI 25.0-25.9,adult Discussed diet and exercise for person with BMI >25 Will recheck weight in 3-6 months   7. Simple chronic bronchitis (HCC) Breztri as needed - Budeson-Glycopyrrol-Formoterol (BREZTRI AEROSPHERE) 160-9-4.8 MCG/ACT AERO; Inhale 1 puff into the lungs daily.  Dispense: 10.7 g; Refill: 11   Labs pending Health Maintenance reviewed Diet and exercise encouraged  Follow up plan: 6 months   [place], FNP

## 2022-02-20 LAB — CBC WITH DIFFERENTIAL/PLATELET
Basophils Absolute: 0 10*3/uL (ref 0.0–0.2)
Basos: 1 %
EOS (ABSOLUTE): 0.1 10*3/uL (ref 0.0–0.4)
Eos: 1 %
Hematocrit: 41.6 % (ref 34.0–46.6)
Hemoglobin: 14 g/dL (ref 11.1–15.9)
Immature Grans (Abs): 0 10*3/uL (ref 0.0–0.1)
Immature Granulocytes: 0 %
Lymphocytes Absolute: 2.4 10*3/uL (ref 0.7–3.1)
Lymphs: 37 %
MCH: 30.6 pg (ref 26.6–33.0)
MCHC: 33.7 g/dL (ref 31.5–35.7)
MCV: 91 fL (ref 79–97)
Monocytes Absolute: 0.5 10*3/uL (ref 0.1–0.9)
Monocytes: 8 %
Neutrophils Absolute: 3.4 10*3/uL (ref 1.4–7.0)
Neutrophils: 53 %
Platelets: 165 10*3/uL (ref 150–450)
RBC: 4.57 x10E6/uL (ref 3.77–5.28)
RDW: 12.6 % (ref 11.7–15.4)
WBC: 6.3 10*3/uL (ref 3.4–10.8)

## 2022-02-20 LAB — CMP14+EGFR
ALT: 25 IU/L (ref 0–32)
AST: 31 IU/L (ref 0–40)
Albumin/Globulin Ratio: 1.7 (ref 1.2–2.2)
Albumin: 4.3 g/dL (ref 3.7–4.7)
Alkaline Phosphatase: 54 IU/L (ref 44–121)
BUN/Creatinine Ratio: 16 (ref 12–28)
BUN: 12 mg/dL (ref 8–27)
Bilirubin Total: 0.6 mg/dL (ref 0.0–1.2)
CO2: 25 mmol/L (ref 20–29)
Calcium: 9.5 mg/dL (ref 8.7–10.3)
Chloride: 103 mmol/L (ref 96–106)
Creatinine, Ser: 0.75 mg/dL (ref 0.57–1.00)
Globulin, Total: 2.6 g/dL (ref 1.5–4.5)
Glucose: 107 mg/dL — ABNORMAL HIGH (ref 70–99)
Potassium: 3.3 mmol/L — ABNORMAL LOW (ref 3.5–5.2)
Sodium: 146 mmol/L — ABNORMAL HIGH (ref 134–144)
Total Protein: 6.9 g/dL (ref 6.0–8.5)
eGFR: 84 mL/min/{1.73_m2} (ref >59)

## 2022-02-20 LAB — LIPID PANEL
Chol/HDL Ratio: 3.5 ratio (ref 0.0–4.4)
Cholesterol, Total: 129 mg/dL (ref 100–199)
HDL: 37 mg/dL — ABNORMAL LOW (ref >39)
LDL Chol Calc (NIH): 65 mg/dL (ref 0–99)
Triglycerides: 157 mg/dL — ABNORMAL HIGH (ref 0–149)
VLDL Cholesterol Cal: 27 mg/dL (ref 5–40)

## 2022-08-18 ENCOUNTER — Other Ambulatory Visit: Payer: Self-pay | Admitting: Nurse Practitioner

## 2022-08-18 DIAGNOSIS — M810 Age-related osteoporosis without current pathological fracture: Secondary | ICD-10-CM

## 2022-08-24 ENCOUNTER — Ambulatory Visit: Payer: Medicare HMO | Admitting: Nurse Practitioner

## 2022-08-31 ENCOUNTER — Ambulatory Visit: Payer: Medicare HMO | Admitting: Nurse Practitioner

## 2022-09-01 ENCOUNTER — Ambulatory Visit (INDEPENDENT_AMBULATORY_CARE_PROVIDER_SITE_OTHER): Payer: Medicare HMO

## 2022-09-01 VITALS — Ht <= 58 in | Wt 120.0 lb

## 2022-09-01 DIAGNOSIS — Z0000 Encounter for general adult medical examination without abnormal findings: Secondary | ICD-10-CM

## 2022-09-01 DIAGNOSIS — Z1211 Encounter for screening for malignant neoplasm of colon: Secondary | ICD-10-CM

## 2022-09-01 NOTE — Patient Instructions (Signed)
Rebecca Macdonald , Thank you for taking time to come for your Medicare Wellness Visit. I appreciate your ongoing commitment to your health goals. Please review the following plan we discussed and let me know if I can assist you in the future.   These are the goals we discussed:  Goals      DIET - INCREASE WATER INTAKE     Exercise 3x per week (30 min per time)     Try to exercise for at least 30 minutes, 3 times weekly     Increase physical activity        This is a list of the screening recommended for you and due dates:  Health Maintenance  Topic Date Due   Flu Shot  03/16/2022   Colon Cancer Screening  09/25/2022   Mammogram  10/22/2022   Medicare Annual Wellness Visit  09/02/2023   DTaP/Tdap/Td vaccine (2 - Td or Tdap) 02/06/2030   Pneumonia Vaccine  Completed   DEXA scan (bone density measurement)  Completed   COVID-19 Vaccine  Completed   Hepatitis C Screening: USPSTF Recommendation to screen - Ages 71-79 yo.  Completed   Zoster (Shingles) Vaccine  Completed   HPV Vaccine  Aged Out    Advanced directives: Advance directive discussed with you today. I have provided a copy for you to complete at home and have notarized. Once this is complete please bring a copy in to our office so we can scan it into your chart.   Conditions/risks identified: Aim for 30 minutes of exercise or brisk walking, 6-8 glasses of water, and 5 servings of fruits and vegetables each day.   Next appointment: Follow up in one year for your annual wellness visit    Preventive Care 65 Years and Older, Female Preventive care refers to lifestyle choices and visits with your health care provider that can promote health and wellness. What does preventive care include? A yearly physical exam. This is also called an annual well check. Dental exams once or twice a year. Routine eye exams. Ask your health care provider how often you should have your eyes checked. Personal lifestyle choices, including: Daily care of  your teeth and gums. Regular physical activity. Eating a healthy diet. Avoiding tobacco and drug use. Limiting alcohol use. Practicing safe sex. Taking low-dose aspirin every day. Taking vitamin and mineral supplements as recommended by your health care provider. What happens during an annual well check? The services and screenings done by your health care provider during your annual well check will depend on your age, overall health, lifestyle risk factors, and family history of disease. Counseling  Your health care provider may ask you questions about your: Alcohol use. Tobacco use. Drug use. Emotional well-being. Home and relationship well-being. Sexual activity. Eating habits. History of falls. Memory and ability to understand (cognition). Work and work Statistician. Reproductive health. Screening  You may have the following tests or measurements: Height, weight, and BMI. Blood pressure. Lipid and cholesterol levels. These may be checked every 5 years, or more frequently if you are over 34 years old. Skin check. Lung cancer screening. You may have this screening every year starting at age 45 if you have a 30-pack-year history of smoking and currently smoke or have quit within the past 15 years. Fecal occult blood test (FOBT) of the stool. You may have this test every year starting at age 19. Flexible sigmoidoscopy or colonoscopy. You may have a sigmoidoscopy every 5 years or a colonoscopy every 10 years  starting at age 7. Hepatitis C blood test. Hepatitis B blood test. Sexually transmitted disease (STD) testing. Diabetes screening. This is done by checking your blood sugar (glucose) after you have not eaten for a while (fasting). You may have this done every 1-3 years. Bone density scan. This is done to screen for osteoporosis. You may have this done starting at age 2. Mammogram. This may be done every 1-2 years. Talk to your health care provider about how often you should  have regular mammograms. Talk with your health care provider about your test results, treatment options, and if necessary, the need for more tests. Vaccines  Your health care provider may recommend certain vaccines, such as: Influenza vaccine. This is recommended every year. Tetanus, diphtheria, and acellular pertussis (Tdap, Td) vaccine. You may need a Td booster every 10 years. Zoster vaccine. You may need this after age 57. Pneumococcal 13-valent conjugate (PCV13) vaccine. One dose is recommended after age 23. Pneumococcal polysaccharide (PPSV23) vaccine. One dose is recommended after age 42. Talk to your health care provider about which screenings and vaccines you need and how often you need them. This information is not intended to replace advice given to you by your health care provider. Make sure you discuss any questions you have with your health care provider. Document Released: 08/29/2015 Document Revised: 04/21/2016 Document Reviewed: 06/03/2015 Elsevier Interactive Patient Education  2017 [place] Prevention in the Home Falls can cause injuries. They can happen to people of all ages. There are many things you can do to make your home safe and to help prevent falls. What can I do on the outside of my home? Regularly fix the edges of walkways and driveways and fix any cracks. Remove anything that might make you trip as you walk through a door, such as a raised step or threshold. Trim any bushes or trees on the path to your home. Use bright outdoor lighting. Clear any walking paths of anything that might make someone trip, such as rocks or tools. Regularly check to see if handrails are loose or broken. Make sure that both sides of any steps have handrails. Any raised decks and porches should have guardrails on the edges. Have any leaves, snow, or ice cleared regularly. Use sand or salt on walking paths during winter. Clean up any spills in your garage right away. This  includes oil or grease spills. What can I do in the bathroom? Use night lights. Install grab bars by the toilet and in the tub and shower. Do not use towel bars as grab bars. Use non-skid mats or decals in the tub or shower. If you need to sit down in the shower, use a plastic, non-slip stool. Keep the floor dry. Clean up any water that spills on the floor as soon as it happens. Remove soap buildup in the tub or shower regularly. Attach bath mats securely with double-sided non-slip rug tape. Do not have throw rugs and other things on the floor that can make you trip. What can I do in the bedroom? Use night lights. Make sure that you have a light by your bed that is easy to reach. Do not use any sheets or blankets that are too big for your bed. They should not hang down onto the floor. Have a firm chair that has side arms. You can use this for support while you get dressed. Do not have throw rugs and other things on the floor that can make you trip. What  can I do in the kitchen? Clean up any spills right away. Avoid walking on wet floors. Keep items that you use a lot in easy-to-reach places. If you need to reach something above you, use a strong step stool that has a grab bar. Keep electrical cords out of the way. Do not use floor polish or wax that makes floors slippery. If you must use wax, use non-skid floor wax. Do not have throw rugs and other things on the floor that can make you trip. What can I do with my stairs? Do not leave any items on the stairs. Make sure that there are handrails on both sides of the stairs and use them. Fix handrails that are broken or loose. Make sure that handrails are as long as the stairways. Check any carpeting to make sure that it is firmly attached to the stairs. Fix any carpet that is loose or worn. Avoid having throw rugs at the top or bottom of the stairs. If you do have throw rugs, attach them to the floor with carpet tape. Make sure that you  have a light switch at the top of the stairs and the bottom of the stairs. If you do not have them, ask someone to add them for you. What else can I do to help prevent falls? Wear shoes that: Do not have high heels. Have rubber bottoms. Are comfortable and fit you well. Are closed at the toe. Do not wear sandals. If you use a stepladder: Make sure that it is fully opened. Do not climb a closed stepladder. Make sure that both sides of the stepladder are locked into place. Ask someone to hold it for you, if possible. Clearly mark and make sure that you can see: Any grab bars or handrails. First and last steps. Where the edge of each step is. Use tools that help you move around (mobility aids) if they are needed. These include: Canes. Walkers. Scooters. Crutches. Turn on the lights when you go into a dark area. Replace any light bulbs as soon as they burn out. Set up your furniture so you have a clear path. Avoid moving your furniture around. If any of your floors are uneven, fix them. If there are any pets around you, be aware of where they are. Review your medicines with your doctor. Some medicines can make you feel dizzy. This can increase your chance of falling. Ask your doctor what other things that you can do to help prevent falls. This information is not intended to replace advice given to you by your health care provider. Make sure you discuss any questions you have with your health care provider. Document Released: 05/29/2009 Document Revised: 01/08/2016 Document Reviewed: 09/06/2014 Elsevier Interactive Patient Education  2017 [employer].

## 2022-09-01 NOTE — Progress Notes (Signed)
Subjective:   Rebecca Macdonald is a 77 y.o. female who presents for Medicare Annual (Subsequent) preventive examination. I connected with  Fionnuala Hemmerich on 09/01/22 by a audio enabled telemedicine application and verified that I am speaking with the correct person using two identifiers.  Patient Location: Home  Provider Location: Home Office  I discussed the limitations of evaluation and management by telemedicine. The patient expressed understanding and agreed to proceed.  Review of Systems     Cardiac Risk Factors include: advanced age (>27mn, >>39women)     Objective:    Today's Vitals   09/01/22 0813  Weight: 120 lb (54.4 kg)  Height: '4\' 9"'$  (1.448 m)   Body mass index is 25.97 kg/m.     09/01/2022    8:18 AM 08/31/2021    8:25 AM 02/05/2020    2:56 PM 09/26/2019   12:13 PM 01/26/2019    2:57 PM 06/15/2016   11:19 AM  Advanced Directives  Does Patient Have a Medical Advance Directive? No No No No No No  Would patient like information on creating a medical advance directive? No - Patient declined No - Patient declined No - Patient declined No - Patient declined Yes (MAU/Ambulatory/Procedural Areas - Information given) No - patient declined information    Current Medications (verified) Outpatient Encounter Medications as of 09/01/2022  Medication Sig   acetaminophen (TYLENOL) 500 MG tablet Take 500 mg by mouth daily as needed for moderate pain or headache.    alendronate (FOSAMAX) 70 MG tablet TAKE 1 TABLET EVERY SUNDAY ON EMPTY STOMACH WITH A FULL GLASS OF WATER   Budeson-Glycopyrrol-Formoterol (BREZTRI AEROSPHERE) 160-9-4.8 MCG/ACT AERO Inhale 1 puff into the lungs daily.   Calcium-Magnesium-Vitamin D (CALCIUM 1200+D3 PO) Take 1 tablet by mouth daily.   Cholecalciferol (VITAMIN D) 50 MCG (2000 UT) tablet Take 2,000 Units by mouth daily.   famotidine (PEPCID) 20 MG tablet Take 1 tablet (20 mg total) by mouth 2 (two) times daily.   pravastatin (PRAVACHOL) 40 MG tablet Take 1  tablet (40 mg total) by mouth daily.   loratadine (CLARITIN) 10 MG tablet Take 1 tablet (10 mg total) by mouth daily. (Patient not taking: Reported on 09/01/2022)   No facility-administered encounter medications on file as of 09/01/2022.    Allergies (verified) Patient has no known allergies.   History: Past Medical History:  Diagnosis Date   Asthma    Cataract    Hypertension    Past Surgical History:  Procedure Laterality Date   CHOLECYSTECTOMY  2013   COLONOSCOPY N/A 06/15/2016   Procedure: COLONOSCOPY;  Surgeon: RDaneil Dolin MD;  Location: [facility];  Service: Endoscopy;  Laterality: N/A;  1215   COLONOSCOPY N/A 09/26/2019   Procedure: COLONOSCOPY;  Surgeon: RDaneil Dolin MD;  Location: [facility];  Service: Endoscopy;  Laterality: N/A;  1:00-office said pt can't come any earlier d/t transportation   POLYPECTOMY  06/15/2016   Procedure: POLYPECTOMY;  Surgeon: RDaneil Dolin MD;  Location: [facility];  Service: Endoscopy;;  colon   POLYPECTOMY  09/26/2019   Procedure: POLYPECTOMY;  Surgeon: RDaneil Dolin MD;  Location: [facility];  Service: Endoscopy;;   Family History  Problem Relation Age of Onset   Colon polyps Brother        high grade required partial colectomy but not cancer.    Colon cancer Neg Hx    Social History   Socioeconomic History   Marital status: Widowed    Spouse  name: Not on file   Number of children: 1   Years of education: Not on file   Highest education level: 11th grade  Occupational History   Occupation: Retired  Tobacco Use   Smoking status: Former    Types: Cigarettes    Quit date: 03/13/2009    Years since quitting: 13.4   Smokeless tobacco: Never  Vaping Use   Vaping Use: Never used  Substance and Sexual Activity   Alcohol use: No   Drug use: No   Sexual activity: Yes  Other Topics Concern   Not on file  Social History Narrative   1 son, talks to or sees daily.   Social Determinants of Health   Financial  Resource Strain: Low Risk  (09/01/2022)   Overall Financial Resource Strain (CARDIA)    Difficulty of Paying Living Expenses: Not hard at all  Food Insecurity: No Food Insecurity (09/01/2022)   Hunger Vital Sign    Worried About Running Out of Food in the Last Year: Never true    Ran Out of Food in the Last Year: Never true  Transportation Needs: No Transportation Needs (09/01/2022)   PRAPARE - Hydrologist (Medical): No    Lack of Transportation (Non-Medical): No  Physical Activity: Sufficiently Active (09/01/2022)   Exercise Vital Sign    Days of Exercise per Week: 5 days    Minutes of Exercise per Session: 30 min  Stress: No Stress Concern Present (09/01/2022)   [place]    Feeling of Stress : Not at all  Social Connections: Socially Isolated (09/01/2022)   Social Connection and Isolation Panel [NHANES]    Frequency of Communication with Friends and Family: More than three times a week    Frequency of Social Gatherings with Friends and Family: More than three times a week    Attends Religious Services: Never    Marine scientist or Organizations: No    Attends Archivist Meetings: Never    Marital Status: Widowed    Tobacco Counseling Counseling given: Not Answered   Clinical Intake:  Pre-visit preparation completed: Yes  Pain : No/denies pain     Nutritional Risks: None Diabetes: No  How often do you need to have someone help you when you read instructions, pamphlets, or other written materials from your doctor or pharmacy?: 1 - Never  Diabetic?no   Interpreter Needed?: No  Information entered by :: Jadene Pierini, LPN   Activities of Daily Living    09/01/2022    8:18 AM  In your present state of health, do you have any difficulty performing the following activities:  Hearing? 0  Vision? 0  Difficulty concentrating or making decisions? 0  Walking or  climbing stairs? 0  Dressing or bathing? 0  Doing errands, shopping? 0  Preparing Food and eating ? N  Using the Toilet? N  In the past six months, have you accidently leaked urine? N  Do you have problems with loss of bowel control? N  Managing your Medications? N  Managing your Finances? N  Housekeeping or managing your Housekeeping? N    Patient Care Team: [name], FNP as PCP - General (Family Medicine) Gala Romney Cristopher Estimable, MD as Consulting Physician (Gastroenterology)  Indicate any recent Medical Services you may have received from other than [facility] providers in the past year (date may be approximate).     Assessment:   This is a routine  wellness examination for [facility].  Hearing/Vision screen Vision Screening - Comments:: Wears rx glasses - up to date with routine eye exams with  Dr.Johnson   Dietary issues and exercise activities discussed: Current Exercise Habits: Home exercise routine, Type of exercise: walking, Time (Minutes): 30, Frequency (Times/Week): 5, Weekly Exercise (Minutes/Week): 150, Intensity: Mild, Exercise limited by: None identified   Goals Addressed             This Visit's Progress    DIET - INCREASE WATER INTAKE   On track      Depression Screen    09/01/2022    8:17 AM 02/19/2022    2:29 PM 08/31/2021    8:23 AM 08/21/2021   12:22 PM 02/12/2021   11:32 AM 08/12/2020   10:54 AM 02/07/2020   11:36 AM  PHQ 2/9 Scores  PHQ - 2 Score 0 0 0 0 0 0 0  PHQ- 9 Score  0  0 0      Fall Risk    09/01/2022    8:15 AM 02/19/2022    2:29 PM 08/31/2021    8:26 AM 08/21/2021   12:22 PM 02/12/2021   11:32 AM  Fall Risk   Falls in the past year? 0 0 0 0 0  Number falls in past yr: 0  0    Injury with Fall? 0  0    Risk for fall due to : No Fall Risks  Other (Comment)    Risk for fall due to: Comment   Osteoporosis    Follow up Falls prevention discussed  Falls prevention discussed      FALL RISK PREVENTION PERTAINING TO THE HOME:  Any stairs in or  around the home? No  If so, are there any without handrails? No  Home free of loose throw rugs in walkways, pet beds, electrical cords, etc? Yes  Adequate lighting in your home to reduce risk of falls? Yes   ASSISTIVE DEVICES UTILIZED TO PREVENT FALLS:  Life alert? No  Use of a cane, walker or w/c? No  Grab bars in the bathroom? No  Shower chair or bench in shower? No  Elevated toilet seat or a handicapped toilet? No          09/01/2022    8:19 AM 02/05/2020    2:53 PM 01/26/2019    3:01 PM  6CIT Screen  What Year? 0 points 0 points 0 points  What month? 0 points 0 points 0 points  What time? 0 points 0 points 0 points  Count back from 20 0 points 0 points 2 points  Months in reverse 0 points 4 points 2 points  Repeat phrase 0 points 2 points 0 points  Total Score 0 points 6 points 4 points    Immunizations Immunization History  Administered Date(s) Administered   COVID-19, mRNA, vaccine(Comirnaty)12 years and older 06/07/2022   Fluad Quad(high Dose 65+) 05/02/2019, 06/11/2020, 08/21/2021   Influenza, High Dose Seasonal PF 07/26/2018   Influenza,inj,Quad PF,6+ Mos 06/21/2013, 06/25/2016, 07/01/2017   Moderna SARS-COV2 Booster Vaccination 04/14/2021   Moderna Sars-Covid-2 Vaccination 10/16/2019, 11/13/2019, 07/08/2020   Pneumococcal Conjugate-13 06/21/2013   Pneumococcal Polysaccharide-23 04/16/2016   Tdap 02/07/2020   Zoster Recombinat (Shingrix) 02/12/2021, 09/23/2021    TDAP status: Up to date  Flu Vaccine status: Up to date  Pneumococcal vaccine status: Up to date  Covid-19 vaccine status: Completed vaccines  Qualifies for Shingles Vaccine? Yes   Zostavax completed Yes   Shingrix Completed?: Yes  Screening Tests  Health Maintenance  Topic Date Due   INFLUENZA VACCINE  03/16/2022   COLONOSCOPY (Pts 45-67yr Insurance coverage will need to be confirmed)  09/25/2022   MAMMOGRAM  10/22/2022   Medicare Annual Wellness (AWV)  09/02/2023   DTaP/Tdap/Td (2 - Td  or Tdap) 02/06/2030   Pneumonia Vaccine 75 Years old  Completed   DEXA SCAN  Completed   COVID-19 Vaccine  Completed   Hepatitis C Screening  Completed   Zoster Vaccines- Shingrix  Completed   HPV VACCINES  Aged Out    Health Maintenance  Health Maintenance Due  Topic Date Due   INFLUENZA VACCINE  03/16/2022    Colorectal cancer screening: Type of screening: Colonoscopy. Completed 09/26/2019. Repeat every 3 years  Mammogram status: Completed 03/0/2023. Repeat every year  Bone Density status: Completed 12/302021. Results reflect: Bone density results: OSTEOPENIA. Repeat every 5 years.  Lung Cancer Screening: (Low Dose CT Chest recommended if Age 561-80years, 30 pack-year currently smoking OR have quit w/in 15years.) does qualify.   Lung Cancer Screening Referral: declined   Additional Screening:  Hepatitis C Screening: does not qualify;   Vision Screening: Recommended annual ophthalmology exams for early detection of glaucoma and other disorders of the eye. Is the patient up to date with their annual eye exam?  Yes  Who is the provider or what is the name of the office in which the patient attends annual eye exams? Dr.Johnson  If pt is not established with a provider, would they like to be referred to a provider to establish care? No .   Dental Screening: Recommended annual dental exams for proper oral hygiene  Community Resource Referral / Chronic Care Management: CRR required this visit?  No   CCM required this visit?  No      Plan:     I have personally reviewed and noted the following in the patient's chart:   Medical and social history Use of alcohol, tobacco or illicit drugs  Current medications and supplements including opioid prescriptions. Patient is not currently taking opioid prescriptions. Functional ability and status Nutritional status Physical activity Advanced directives List of other physicians Hospitalizations, surgeries, and ER visits in  previous 12 months Vitals Screenings to include cognitive, depression, and falls Referrals and appointments  In addition, I have reviewed and discussed with patient certain preventive protocols, quality metrics, and best practice recommendations. A written personalized care plan for preventive services as well as general preventive health recommendations were provided to patient.     LDaphane Shepherd LPN   13/15/4008  Nurse Notes: Due Flu Vaccine

## 2022-09-02 ENCOUNTER — Ambulatory Visit (INDEPENDENT_AMBULATORY_CARE_PROVIDER_SITE_OTHER): Payer: Medicare HMO | Admitting: Nurse Practitioner

## 2022-09-02 ENCOUNTER — Encounter: Payer: Self-pay | Admitting: Nurse Practitioner

## 2022-09-02 VITALS — BP 130/86 | HR 103 | Temp 97.8°F | Resp 20 | Ht <= 58 in | Wt 120.0 lb

## 2022-09-02 DIAGNOSIS — E559 Vitamin D deficiency, unspecified: Secondary | ICD-10-CM | POA: Diagnosis not present

## 2022-09-02 DIAGNOSIS — Z6825 Body mass index (BMI) 25.0-25.9, adult: Secondary | ICD-10-CM | POA: Diagnosis not present

## 2022-09-02 DIAGNOSIS — J410 Simple chronic bronchitis: Secondary | ICD-10-CM | POA: Diagnosis not present

## 2022-09-02 DIAGNOSIS — D367 Benign neoplasm of other specified sites: Secondary | ICD-10-CM | POA: Diagnosis not present

## 2022-09-02 DIAGNOSIS — Z23 Encounter for immunization: Secondary | ICD-10-CM | POA: Diagnosis not present

## 2022-09-02 DIAGNOSIS — E782 Mixed hyperlipidemia: Secondary | ICD-10-CM

## 2022-09-02 DIAGNOSIS — K219 Gastro-esophageal reflux disease without esophagitis: Secondary | ICD-10-CM

## 2022-09-02 MED ORDER — PRAVASTATIN SODIUM 40 MG PO TABS: 40.0000 mg | ORAL_TABLET | Freq: Every day | ORAL | 3 refills | Status: DC

## 2022-09-02 MED ORDER — FAMOTIDINE 20 MG PO TABS: 20.0000 mg | ORAL_TABLET | Freq: Two times a day (BID) | ORAL | 3 refills | Status: DC

## 2022-09-02 MED ORDER — BREZTRI AEROSPHERE 160-9-4.8 MCG/ACT IN AERO: 1.0000 | INHALATION_SPRAY | Freq: Every day | RESPIRATORY_TRACT | 11 refills | Status: DC

## 2022-09-02 NOTE — Addendum Note (Signed)
Addended by: Rolena Infante on: 09/02/2022 03:17 PM   Modules accepted: Orders

## 2022-09-02 NOTE — Patient Instructions (Signed)

## 2022-09-02 NOTE — Addendum Note (Signed)
Addended by: [name] on: 09/02/2022 02:46 PM   Modules accepted: Level of Service

## 2022-09-02 NOTE — Progress Notes (Signed)
Subjective:    Patient ID: Rebecca Macdonald, female    DOB: 1946-02-22, 77 y.o.   MRN: [id number]   Chief Complaint: medical management of chronic issues     HPI:  Rebecca Macdonald is a 77 y.o. who identifies as a female who was assigned female at birth.   Social history: Lives with: by herself- family checks on her daily Work history: retired   Scientist, forensic in today for follow up of the following chronic medical issues:  1. Mixed hyperlipidemia Does try to watch diet but does no dedicated exercises. Lab Results  Component Value Date   CHOL 129 02/19/2022   HDL 37 (L) 02/19/2022   LDLCALC 65 02/19/2022   TRIG 157 (H) 02/19/2022   CHOLHDL 3.5 02/19/2022     2. Gastroesophageal reflux disease without esophagitis Is on pepcid daily and is doing well  3. Vitamin D deficiency Is on daily vitamin d supplement  4. BMI 25.0-25.9,adult No recent weight changes Wt Readings from Last 3 Encounters:  09/02/22 120 lb (54.4 kg)  09/01/22 120 lb (54.4 kg)  02/19/22 120 lb (54.4 kg)   BMI Readings from Last 3 Encounters:  09/02/22 25.97 kg/m  09/01/22 25.97 kg/m  02/19/22 24.24 kg/m     New complaints: None today  No Known Allergies Outpatient Encounter Medications as of 09/02/2022  Medication Sig   acetaminophen (TYLENOL) 500 MG tablet Take 500 mg by mouth daily as needed for moderate pain or headache.    alendronate (FOSAMAX) 70 MG tablet TAKE 1 TABLET EVERY SUNDAY ON EMPTY STOMACH WITH A FULL GLASS OF WATER   Budeson-Glycopyrrol-Formoterol (BREZTRI AEROSPHERE) 160-9-4.8 MCG/ACT AERO Inhale 1 puff into the lungs daily.   Calcium-Magnesium-Vitamin D (CALCIUM 1200+D3 PO) Take 1 tablet by mouth daily.   Cholecalciferol (VITAMIN D) 50 MCG (2000 UT) tablet Take 2,000 Units by mouth daily.   famotidine (PEPCID) 20 MG tablet Take 1 tablet (20 mg total) by mouth 2 (two) times daily.   loratadine (CLARITIN) 10 MG tablet Take 1 tablet (10 mg total) by mouth daily. (Patient not taking: Reported  on 09/01/2022)   pravastatin (PRAVACHOL) 40 MG tablet Take 1 tablet (40 mg total) by mouth daily.   No facility-administered encounter medications on file as of 09/02/2022.    Past Surgical History:  Procedure Laterality Date   CHOLECYSTECTOMY  2013   COLONOSCOPY N/A 06/15/2016   Procedure: COLONOSCOPY;  Surgeon: Daneil Dolin, MD;  [facility];  Service: Endoscopy;  Laterality: N/A;  1215   COLONOSCOPY N/A 09/26/2019   Procedure: COLONOSCOPY;  Surgeon: Daneil Dolin, MD;  [facility];  Service: Endoscopy;  Laterality: N/A;  1:00-office said pt can't come any earlier d/t transportation   POLYPECTOMY  06/15/2016   Procedure: POLYPECTOMY;  Surgeon: Daneil Dolin, MD;  [facility];  Service: Endoscopy;;  colon   POLYPECTOMY  09/26/2019   Procedure: POLYPECTOMY;  Surgeon: Daneil Dolin, MD;  [facility];  Service: Endoscopy;;    Family History  Problem Relation Age of Onset   Colon polyps Brother        high grade required partial colectomy but not cancer.    Colon cancer Neg Hx       Controlled substance contract: n/a     Review of Systems  Constitutional:  Negative for diaphoresis.  Eyes:  Negative for pain.  Respiratory:  Negative for shortness of breath.   Cardiovascular:  Negative for chest pain, palpitations and leg  swelling.  Gastrointestinal:  Negative for abdominal pain.  Endocrine: Negative for polydipsia.  Skin:  Negative for rash.  Neurological:  Negative for dizziness, weakness and headaches.  Hematological:  Does not bruise/bleed easily.  All other systems reviewed and are negative.      Objective:   Physical Exam Vitals and nursing note reviewed.  Constitutional:      General: She is not in acute distress.    Appearance: Normal appearance. She is well-developed.  HENT:     Head: Normocephalic.     Right Ear: Tympanic membrane normal.     Left Ear: Tympanic membrane normal.     Nose: Nose normal.      Mouth/Throat:     Mouth: Mucous membranes are moist.  Eyes:     Pupils: Pupils are equal, round, and reactive to light.  Neck:     Vascular: No carotid bruit or JVD.  Cardiovascular:     Rate and Rhythm: Normal rate and regular rhythm.     Heart sounds: Normal heart sounds.  Pulmonary:     Effort: Pulmonary effort is normal. No respiratory distress.     Breath sounds: Normal breath sounds. No wheezing or rales.  Chest:     Chest wall: No tenderness.  Abdominal:     General: Bowel sounds are normal. There is no distension or abdominal bruit.     Palpations: Abdomen is soft. There is no hepatomegaly, splenomegaly, mass or pulsatile mass.     Tenderness: There is no abdominal tenderness.  Musculoskeletal:        General: Normal range of motion.     Cervical back: Normal range of motion and neck supple.  Lymphadenopathy:     Cervical: No cervical adenopathy.  Skin:    General: Skin is warm and dry.  Neurological:     Mental Status: She is alert and oriented to person, place, and time.     Deep Tendon Reflexes: Reflexes are normal and symmetric.  Psychiatric:        Behavior: Behavior normal.        Thought Content: Thought content normal.        Judgment: Judgment normal.     BP 130/86   Pulse (!) 103   Temp 97.8 F (36.6 C) (Temporal)   Resp 20   Ht '4\' 9"'$  (1.448 m)   Wt 120 lb (54.4 kg)   SpO2 95%   BMI 25.97 kg/m        Rebecca Macdonald comes in today with chief complaint of Medical Management of Chronic Issues   Diagnosis and orders addressed:  1. Mixed hyperlipidemia Low fat diet - pravastatin (PRAVACHOL) 40 MG tablet; Take 1 tablet (40 mg total) by mouth daily.  Dispense: 90 tablet; Refill: 3 - CBC with Differential/Platelet - CMP14+EGFR - Lipid panel  2. Gastroesophageal reflux disease without esophagitis Avoid spicy foods Do not eat 2 hours prior to bedtime - famotidine (PEPCID) 20 MG tablet; Take 1 tablet (20 mg total) by mouth 2 (two) times daily.   Dispense: 180 tablet; Refill: 3  3. Vitamin D deficiency Continue daily vitamin d supplement  4. BMI 25.0-25.9,adult Discussed diet and exercise for person with BMI >25 Will recheck weight in 3-6 months   5. Simple chronic bronchitis (HCC) - Budeson-Glycopyrrol-Formoterol (BREZTRI AEROSPHERE) 160-9-4.8 MCG/ACT AERO; Inhale 1 puff into the lungs daily.  Dispense: 10.7 g; Refill: 11   Labs pending Health Maintenance reviewed Diet and exercise encouraged  Follow up plan: 6 months  Rebecca Amari Burnsworth, FNP   

## 2022-09-03 LAB — CBC WITH DIFFERENTIAL/PLATELET
Basophils Absolute: 0 10*3/uL (ref 0.0–0.2)
Basos: 1 %
EOS (ABSOLUTE): 0 10*3/uL (ref 0.0–0.4)
Eos: 0 %
Hematocrit: 42.7 % (ref 34.0–46.6)
Hemoglobin: 14.3 g/dL (ref 11.1–15.9)
Immature Grans (Abs): 0 10*3/uL (ref 0.0–0.1)
Immature Granulocytes: 0 %
Lymphocytes Absolute: 2.6 10*3/uL (ref 0.7–3.1)
Lymphs: 30 %
MCH: 30.2 pg (ref 26.6–33.0)
MCHC: 33.5 g/dL (ref 31.5–35.7)
MCV: 90 fL (ref 79–97)
Monocytes Absolute: 0.7 10*3/uL (ref 0.1–0.9)
Monocytes: 8 %
Neutrophils Absolute: 5.4 10*3/uL (ref 1.4–7.0)
Neutrophils: 61 %
Platelets: 201 10*3/uL (ref 150–450)
RBC: 4.74 x10E6/uL (ref 3.77–5.28)
RDW: 12.3 % (ref 11.7–15.4)
WBC: 8.7 10*3/uL (ref 3.4–10.8)

## 2022-09-03 LAB — CMP14+EGFR
ALT: 26 IU/L (ref 0–32)
AST: 29 IU/L (ref 0–40)
Albumin/Globulin Ratio: 1.8 (ref 1.2–2.2)
Albumin: 4.4 g/dL (ref 3.8–4.8)
Alkaline Phosphatase: 55 IU/L (ref 44–121)
BUN/Creatinine Ratio: 22 (ref 12–28)
BUN: 15 mg/dL (ref 8–27)
Bilirubin Total: 0.5 mg/dL (ref 0.0–1.2)
CO2: 25 mmol/L (ref 20–29)
Calcium: 9.2 mg/dL (ref 8.7–10.3)
Chloride: 102 mmol/L (ref 96–106)
Creatinine, Ser: 0.67 mg/dL (ref 0.57–1.00)
Globulin, Total: 2.4 g/dL (ref 1.5–4.5)
Glucose: 119 mg/dL — ABNORMAL HIGH (ref 70–99)
Potassium: 3.6 mmol/L (ref 3.5–5.2)
Sodium: 142 mmol/L (ref 134–144)
Total Protein: 6.8 g/dL (ref 6.0–8.5)
eGFR: 92 mL/min/{1.73_m2} (ref >59)

## 2022-09-03 LAB — LIPID PANEL
Chol/HDL Ratio: 2.9 ratio (ref 0.0–4.4)
Cholesterol, Total: 129 mg/dL (ref 100–199)
HDL: 44 mg/dL (ref >39)
LDL Chol Calc (NIH): 66 mg/dL (ref 0–99)
Triglycerides: 105 mg/dL (ref 0–149)
VLDL Cholesterol Cal: 19 mg/dL (ref 5–40)

## 2022-09-06 ENCOUNTER — Encounter: Payer: Self-pay | Admitting: *Deleted

## 2022-09-28 ENCOUNTER — Ambulatory Visit (INDEPENDENT_AMBULATORY_CARE_PROVIDER_SITE_OTHER): Payer: Medicare HMO | Admitting: Orthopaedic Surgery

## 2022-09-28 ENCOUNTER — Encounter: Payer: Self-pay | Admitting: Orthopaedic Surgery

## 2022-09-28 ENCOUNTER — Ambulatory Visit (INDEPENDENT_AMBULATORY_CARE_PROVIDER_SITE_OTHER): Payer: Medicare HMO

## 2022-09-28 DIAGNOSIS — R2232 Localized swelling, mass and lump, left upper limb: Secondary | ICD-10-CM | POA: Diagnosis not present

## 2022-09-28 NOTE — Progress Notes (Signed)
Office Visit Note   Patient: Rebecca Macdonald           Date of Birth: Dec 28, 1945           MRN: [id number] Visit Date: 09/28/2022              Requested by: [name], [address] [place] [place],  [place] [address] PCP: [name], FNP   Assessment & Plan: Visit Diagnoses:  1. Forearm mass, left     Plan: Impression is large soft tissue mass to the left forearm.  Will need MRI with contrast for further evaluation.  Follow-up after the MRI.  Follow-Up Instructions: No follow-ups on file.   Orders:  No orders of the defined types were placed in this encounter.  No orders of the defined types were placed in this encounter.     Procedures: No procedures performed   Clinical Data: No additional findings.   Subjective: Chief Complaint  Patient presents with   Left Arm - Pain    HPI  Rebecca Macdonald is a very pleasant 77 year old female here with her sister for evaluation of a soft tissue mass on her left arm for about a year.  Has noticed an increase in size recently.  Denies any constitutional symptoms.  Review of Systems  Constitutional: Negative.   HENT: Negative.    Eyes: Negative.   Respiratory: Negative.    Cardiovascular: Negative.   Endocrine: Negative.   Musculoskeletal: Negative.   Neurological: Negative.   Hematological: Negative.   Psychiatric/Behavioral: Negative.    All other systems reviewed and are negative.    Objective: Vital Signs: There were no vitals taken for this visit.  Physical Exam Vitals and nursing note reviewed.  Constitutional:      Appearance: She is well-developed.  HENT:     Head: Atraumatic.     Nose: Nose normal.  Eyes:     Extraocular Movements: Extraocular movements intact.  Cardiovascular:     Pulses: Normal pulses.  Pulmonary:     Effort: Pulmonary effort is normal.  Abdominal:     Palpations: Abdomen is soft.  Musculoskeletal:     Cervical back: Neck supple.  Skin:    General: Skin is warm.      Capillary Refill: Capillary refill takes less than 2 seconds.  Neurological:     Mental Status: She is alert. Mental status is at baseline.  Psychiatric:        Behavior: Behavior normal.        Thought Content: Thought content normal.        Judgment: Judgment normal.     Ortho Exam  Examination of the left forearm shows a 5 cm circular mass that is semimobile and soft on the dorsal aspect of the proximal ulna.  She has normal elbow range of motion.  No neurovascular compromise.  Specialty Comments:  No specialty comments available.  Imaging: No results found.   PMFS History: Patient Active Problem List   Diagnosis Date Noted   BMI 25.0-25.9,adult 08/07/2019   Age-related osteoporosis without current pathological fracture 01/16/2019   Vitamin D deficiency 01/16/2019   Seasonal allergic rhinitis due to pollen 01/16/2019   Gastroesophageal reflux disease without esophagitis 01/16/2019   Hyperlipidemia 06/25/2016   FHx: colonic polyps 05/26/2016   Benign paroxysmal positional vertigo 03/13/2013   Past Medical History:  Diagnosis Date   Asthma    Cataract    Hypertension     Family History  Problem Relation Age of Onset  Colon polyps Brother        high grade required partial colectomy but not cancer.    Colon cancer Neg Hx     Past Surgical History:  Procedure Laterality Date   CHOLECYSTECTOMY  2013   COLONOSCOPY N/A 06/15/2016   Procedure: COLONOSCOPY;  Surgeon: Daneil Dolin, MD;  [facility];  Service: Endoscopy;  Laterality: N/A;  1215   COLONOSCOPY N/A 09/26/2019   Procedure: COLONOSCOPY;  Surgeon: Daneil Dolin, MD;  [facility];  Service: Endoscopy;  Laterality: N/A;  1:00-office said pt can't come any earlier d/t transportation   POLYPECTOMY  06/15/2016   Procedure: POLYPECTOMY;  Surgeon: Daneil Dolin, MD;  [facility];  Service: Endoscopy;;  colon   POLYPECTOMY  09/26/2019   Procedure: POLYPECTOMY;  Surgeon:  Daneil Dolin, MD;  [facility];  Service: Endoscopy;;   Social History   Occupational History   Occupation: Retired  Tobacco Use   Smoking status: Former    Types: Cigarettes    Quit date: 03/13/2009    Years since quitting: 13.5   Smokeless tobacco: Never  Vaping Use   Vaping Use: Never used  Substance and Sexual Activity   Alcohol use: No   Drug use: No   Sexual activity: Yes

## 2022-10-04 ENCOUNTER — Telehealth: Payer: Self-pay | Admitting: Orthopaedic Surgery

## 2022-10-04 NOTE — Telephone Encounter (Signed)
Patient requesting an appointment closer for her MRI, she want to go to [facility] to have it done..please advise/ patients sister called and gave wrong information

## 2022-10-08 NOTE — Telephone Encounter (Signed)
MRI is in [employer] review with her insurance once approved I will call to change the facility if allowed

## 2022-10-21 ENCOUNTER — Ambulatory Visit
Admission: RE | Admit: 2022-10-21 | Discharge: 2022-10-21 | Disposition: A | Discharge status: Home / Self Care | Payer: Medicare HMO | Source: Ambulatory Visit | Attending: Orthopaedic Surgery | Admitting: Orthopaedic Surgery

## 2022-10-21 DIAGNOSIS — R600 Localized edema: Secondary | ICD-10-CM | POA: Diagnosis not present

## 2022-10-21 DIAGNOSIS — R2232 Localized swelling, mass and lump, left upper limb: Secondary | ICD-10-CM

## 2022-10-21 MED ORDER — GADOPICLENOL 0.5 MMOL/ML IV SOLN
6.0000 mL | Freq: Once | INTRAVENOUS | Status: AC | PRN
  Administered 2022-10-21: 6 mL via INTRAVENOUS

## 2022-11-01 ENCOUNTER — Telehealth (INDEPENDENT_AMBULATORY_CARE_PROVIDER_SITE_OTHER): Payer: Self-pay | Admitting: *Deleted

## 2022-11-01 NOTE — Telephone Encounter (Signed)
  Procedure: Colonoscopy  Height: 4'9 Weight: 120lbs        Have you had a colonoscopy before?  09/26/19, Dr. Gala Romney  Do you have family history of colon cancer?  Yes brother  Do you have a family history of polyps? yes  Previous colonoscopy with polyps removed? yes  Do you have a history colorectal cancer?   no  Are you diabetic?  no  Do you have a prosthetic or mechanical heart valve? no  Do you have a pacemaker/defibrillator?   no  Have you had endocarditis/atrial fibrillation?  no  Do you use supplemental oxygen/CPAP?  no  Have you had joint replacement within the last 12 months?  no  Do you tend to be constipated or have to use laxatives?  no   Do you have history of alcohol use? If yes, how much and how often.    Do you have history or are you using drugs? If yes, what do are you  using?  no  Have you ever had a stroke/heart attack?    Have you ever had a heart or other vascular stent placed,?  Do you take weight loss medication? no  female patients,: have you had a hysterectomy? no                              are you post menopausal?  no                              do you still have your menstrual cycle? no    Date of last menstrual period? 30 years ago  Do you take any blood-thinning medications such as: (Plavix, aspirin, Coumadin, Aggrenox, Brilinta, Xarelto, Eliquis, Pradaxa, Savaysa or Effient)? no  If yes we need the name, milligram, dosage and who is prescribing doctor:               Current Outpatient Medications  Medication Sig Dispense Refill   alendronate (FOSAMAX) 70 MG tablet TAKE 1 TABLET EVERY SUNDAY ON EMPTY STOMACH WITH A FULL GLASS OF WATER 12 tablet 1   Budeson-Glycopyrrol-Formoterol (BREZTRI AEROSPHERE) 160-9-4.8 MCG/ACT AERO Inhale 1 puff into the lungs daily. 10.7 g 11   famotidine (PEPCID) 20 MG tablet Take 1 tablet (20 mg total) by mouth 2 (two) times daily. 180 tablet 3   pravastatin (PRAVACHOL) 40 MG tablet Take 1 tablet (40 mg  total) by mouth daily. 90 tablet 3   No current facility-administered medications for this visit.    No Known Allergies

## 2022-11-05 ENCOUNTER — Ambulatory Visit (INDEPENDENT_AMBULATORY_CARE_PROVIDER_SITE_OTHER): Payer: Medicare HMO | Admitting: Orthopaedic Surgery

## 2022-11-05 DIAGNOSIS — R2232 Localized swelling, mass and lump, left upper limb: Secondary | ICD-10-CM

## 2022-11-05 NOTE — Progress Notes (Signed)
Office Visit Note   Patient: Rebecca Macdonald           Date of Birth: 1945-08-24           MRN: [id number] Visit Date: 11/05/2022              Requested by: [name], [address],  [place] [address] PCP: [name], FNP   Assessment & Plan: Visit Diagnoses:  1. Forearm mass, left     Plan: MRI is consistent with either bursitis or liquefied hematoma.  Sounds like it is resorbing and given the MRI findings I recommended continued symptomatic management and compression.  I think ultimately there is a good chance that this will resolve on its own.  Follow-Up Instructions: No follow-ups on file.   Orders:  No orders of the defined types were placed in this encounter.  No orders of the defined types were placed in this encounter.     Procedures: No procedures performed   Clinical Data: No additional findings.   Subjective: No chief complaint on file.   HPI  Rebecca Macdonald returns today for MRI discussion.  States that the mass is actually improved in size.  Review of Systems  Constitutional: Negative.   HENT: Negative.    Eyes: Negative.   Respiratory: Negative.    Cardiovascular: Negative.   Endocrine: Negative.   Musculoskeletal: Negative.   Neurological: Negative.   Hematological: Negative.   Psychiatric/Behavioral: Negative.    All other systems reviewed and are negative.    Objective: Vital Signs: There were no vitals taken for this visit.  Physical Exam Vitals and nursing note reviewed.  Constitutional:      Appearance: She is well-developed.  HENT:     Head: Atraumatic.     Nose: Nose normal.  Eyes:     Extraocular Movements: Extraocular movements intact.  Cardiovascular:     Pulses: Normal pulses.  Pulmonary:     Effort: Pulmonary effort is normal.  Abdominal:     Palpations: Abdomen is soft.  Musculoskeletal:     Cervical back: Neck supple.  Skin:    General: Skin is warm.     Capillary Refill:  Capillary refill takes less than 2 seconds.  Neurological:     Mental Status: She is alert. Mental status is at baseline.  Psychiatric:        Behavior: Behavior normal.        Thought Content: Thought content normal.        Judgment: Judgment normal.     Ortho Exam  Exam nation shows decreased mass over the forearm.  No signs of infection.  Specialty Comments:  No specialty comments available.  Imaging: No results found.   PMFS History: Patient Active Problem List   Diagnosis Date Noted   BMI 25.0-25.9,adult 08/07/2019   Age-related osteoporosis without current pathological fracture 01/16/2019   Vitamin D deficiency 01/16/2019   Seasonal allergic rhinitis due to pollen 01/16/2019   Gastroesophageal reflux disease without esophagitis 01/16/2019   Hyperlipidemia 06/25/2016   FHx: colonic polyps 05/26/2016   Benign paroxysmal positional vertigo 03/13/2013   Past Medical History:  Diagnosis Date   Asthma    Cataract    Hypertension     Family History  Problem Relation Age of Onset   Colon polyps Brother        high grade required partial colectomy but not cancer.    Colon cancer Neg Hx     Past Surgical History:  Procedure  Laterality Date   CHOLECYSTECTOMY  2013   COLONOSCOPY N/A 06/15/2016   Procedure: COLONOSCOPY;  Surgeon: Daneil Dolin, MD;  [facility];  Service: Endoscopy;  Laterality: N/A;  1215   COLONOSCOPY N/A 09/26/2019   Procedure: COLONOSCOPY;  Surgeon: Daneil Dolin, MD;  [facility];  Service: Endoscopy;  Laterality: N/A;  1:00-office said pt can't come any earlier d/t transportation   POLYPECTOMY  06/15/2016   Procedure: POLYPECTOMY;  Surgeon: Daneil Dolin, MD;  [facility];  Service: Endoscopy;;  colon   POLYPECTOMY  09/26/2019   Procedure: POLYPECTOMY;  Surgeon: Daneil Dolin, MD;  [facility];  Service: Endoscopy;;   Social History   Occupational History   Occupation: Retired  Tobacco Use    Smoking status: Former    Types: Cigarettes    Quit date: 03/13/2009    Years since quitting: 13.6   Smokeless tobacco: Never  Vaping Use   Vaping Use: Never used  Substance and Sexual Activity   Alcohol use: No   Drug use: No   Sexual activity: Yes

## 2022-11-29 NOTE — Telephone Encounter (Signed)
3 year surveillance due. Last colonoscopy 2021 with tubular adenomas and sessile serrated adenoma.   ASA 2. Appropriate.

## 2022-11-29 NOTE — Telephone Encounter (Signed)
LMOVM to call back 

## 2022-11-30 ENCOUNTER — Encounter: Payer: Self-pay | Admitting: *Deleted

## 2022-11-30 ENCOUNTER — Other Ambulatory Visit: Payer: Self-pay | Admitting: *Deleted

## 2022-11-30 MED ORDER — NA SULFATE-K SULFATE-MG SULF 17.5-3.13-1.6 GM/177ML PO SOLN: ORAL | 0 refills | Status: DC

## 2022-11-30 NOTE — Telephone Encounter (Signed)
Pt has been scheduled for 01/03/23, instructions mailed and prep sent to the pharmacy.

## 2022-12-02 ENCOUNTER — Other Ambulatory Visit (HOSPITAL_COMMUNITY): Payer: Self-pay | Admitting: Nurse Practitioner

## 2022-12-02 DIAGNOSIS — Z1231 Encounter for screening mammogram for malignant neoplasm of breast: Secondary | ICD-10-CM

## 2022-12-13 ENCOUNTER — Encounter (HOSPITAL_COMMUNITY): Payer: Self-pay

## 2022-12-13 ENCOUNTER — Ambulatory Visit (HOSPITAL_COMMUNITY)
Admission: RE | Admit: 2022-12-13 | Discharge: 2022-12-13 | Disposition: A | Discharge status: Home / Self Care | Payer: Medicare HMO | Source: Ambulatory Visit | Attending: Nurse Practitioner | Admitting: Nurse Practitioner

## 2022-12-13 DIAGNOSIS — Z1231 Encounter for screening mammogram for malignant neoplasm of breast: Secondary | ICD-10-CM | POA: Diagnosis not present

## 2023-01-03 ENCOUNTER — Encounter (HOSPITAL_COMMUNITY): Payer: Self-pay | Admitting: Internal Medicine

## 2023-01-03 ENCOUNTER — Encounter (HOSPITAL_COMMUNITY)
Admission: RE | Disposition: A | Discharge status: Home / Self Care | Payer: Self-pay | Source: Home / Self Care | Attending: Internal Medicine

## 2023-01-03 ENCOUNTER — Other Ambulatory Visit: Payer: Self-pay

## 2023-01-03 ENCOUNTER — Ambulatory Visit (HOSPITAL_COMMUNITY)
Admission: RE | Admit: 2023-01-03 | Discharge: 2023-01-03 | Disposition: A | Discharge status: Home / Self Care | Payer: Medicare HMO | Attending: Internal Medicine | Admitting: Internal Medicine

## 2023-01-03 ENCOUNTER — Ambulatory Visit (HOSPITAL_BASED_OUTPATIENT_CLINIC_OR_DEPARTMENT_OTHER): Payer: Medicare HMO | Admitting: Certified Registered Nurse Anesthetist

## 2023-01-03 ENCOUNTER — Ambulatory Visit (HOSPITAL_COMMUNITY): Payer: Medicare HMO | Admitting: Certified Registered Nurse Anesthetist

## 2023-01-03 DIAGNOSIS — I10 Essential (primary) hypertension: Secondary | ICD-10-CM | POA: Insufficient documentation

## 2023-01-03 DIAGNOSIS — K5730 Diverticulosis of large intestine without perforation or abscess without bleeding: Secondary | ICD-10-CM

## 2023-01-03 DIAGNOSIS — Z86010 Personal history of colonic polyps: Secondary | ICD-10-CM

## 2023-01-03 DIAGNOSIS — D124 Benign neoplasm of descending colon: Secondary | ICD-10-CM | POA: Diagnosis not present

## 2023-01-03 DIAGNOSIS — D126 Benign neoplasm of colon, unspecified: Secondary | ICD-10-CM | POA: Diagnosis not present

## 2023-01-03 DIAGNOSIS — Z1211 Encounter for screening for malignant neoplasm of colon: Secondary | ICD-10-CM | POA: Insufficient documentation

## 2023-01-03 DIAGNOSIS — J45909 Unspecified asthma, uncomplicated: Secondary | ICD-10-CM | POA: Diagnosis not present

## 2023-01-03 DIAGNOSIS — D122 Benign neoplasm of ascending colon: Secondary | ICD-10-CM | POA: Insufficient documentation

## 2023-01-03 DIAGNOSIS — K635 Polyp of colon: Secondary | ICD-10-CM | POA: Diagnosis not present

## 2023-01-03 DIAGNOSIS — Z83719 Family history of colon polyps, unspecified: Secondary | ICD-10-CM

## 2023-01-03 DIAGNOSIS — Z87891 Personal history of nicotine dependence: Secondary | ICD-10-CM | POA: Insufficient documentation

## 2023-01-03 HISTORY — PX: POLYPECTOMY: SHX5525

## 2023-01-03 HISTORY — PX: HEMOSTASIS CLIP PLACEMENT: SHX6857

## 2023-01-03 HISTORY — PX: COLONOSCOPY WITH PROPOFOL: SHX5780

## 2023-01-03 SURGERY — COLONOSCOPY WITH PROPOFOL
Anesthesia: General

## 2023-01-03 MED ORDER — PHENYLEPHRINE HCL (PRESSORS) 10 MG/ML IV SOLN
INTRAVENOUS | Status: DC | PRN
  Administered 2023-01-03 (×3): 80 ug via INTRAVENOUS

## 2023-01-03 MED ORDER — PROPOFOL 10 MG/ML IV BOLUS
INTRAVENOUS | Status: DC | PRN
  Administered 2023-01-03: 30 mg via INTRAVENOUS
  Administered 2023-01-03: 50 mg via INTRAVENOUS

## 2023-01-03 MED ORDER — PROPOFOL 500 MG/50ML IV EMUL
INTRAVENOUS | Status: AC
  Filled 2023-01-03: qty 50

## 2023-01-03 MED ORDER — LACTATED RINGERS IV SOLN: INTRAVENOUS | Status: DC

## 2023-01-03 MED ORDER — STERILE WATER FOR IRRIGATION IR SOLN
Status: DC | PRN
  Administered 2023-01-03: 120 mL

## 2023-01-03 MED ORDER — PROPOFOL 500 MG/50ML IV EMUL
INTRAVENOUS | Status: DC | PRN
  Administered 2023-01-03: 150 ug/kg/min via INTRAVENOUS

## 2023-01-03 NOTE — Op Note (Signed)
[facility] Patient Name: Rebecca Macdonald Procedure Date: 01/03/2023 11:04 AM MRN: [id number] Date of Birth: 1946/07/06 Attending MD: Gennette Pac , MD, [id number] CSN: [id number] Age: 77 Admit Type: Outpatient Procedure:                Colonoscopy Indications:              High risk colon cancer surveillance: Personal                            history of colonic polyps Providers:                Gennette Pac, MD, Sheran Fava,                            Pandora Leiter, Technician Referring MD:              Medicines:                Propofol per Anesthesia Complications:            No immediate complications. Estimated Blood Loss:     Estimated blood loss was minimal. Procedure:                Pre-Anesthesia Assessment:                           - Prior to the procedure, a History and Physical                            was performed, and patient medications and                            allergies were reviewed. The patient's tolerance of                            previous anesthesia was also reviewed. The risks                            and benefits of the procedure and the sedation                            options and risks were discussed with the patient.                            All questions were answered, and informed consent                            was obtained. Prior Anticoagulants: The patient has                            taken no anticoagulant or antiplatelet agents. ASA                            Grade Assessment: II - A patient with mild systemic  disease. After reviewing the risks and benefits,                            the patient was deemed in satisfactory condition to                            undergo the procedure.                           After obtaining informed consent, the colonoscope                            was passed under direct vision. Throughout the                            procedure, the  patient's blood pressure, pulse, and                            oxygen saturations were monitored continuously. The                            [id number] scope was introduced through                            the anus and advanced to the the cecum, identified                            by appendiceal orifice and ileocecal valve. The                            colonoscopy was performed without difficulty. The                            patient tolerated the procedure well. The quality                            of the bowel preparation was adequate. The                            ileocecal valve, appendiceal orifice, and rectum                            were photographed. The entire colon was well                            visualized. The colonoscopy was performed without                            difficulty. The patient tolerated the procedure                            well. The quality of the bowel preparation was  adequate. Scope In: 11:52:02 AM Scope Out: 12:04:37 PM Scope Withdrawal Time: 0 hours 9 minutes 1 second  Total Procedure Duration: 0 hours 12 minutes 35 seconds  Findings:      The perianal and digital rectal examinations were normal.      Scattered medium-mouthed diverticula were found in the sigmoid colon and       descending colon.      Three semi-pedunculated polyps were found in the descending colon, mid       descending colon and ascending colon. The polyps were 4 to 8 mm in size.       These polyps were removed with a cold snare. Resection and retrieval       were complete. Estimated blood loss was minimal. The 8 mm descending       colon polyp stalk I wanted to bleed a bit after the procedure was sealed       with 1 clip.      The exam was otherwise without abnormality on direct views. Impression:               - Diverticulosis in the sigmoid colon and in the                            descending colon.                            - Three 4 to 8 mm polyps in the descending colon,                            in the mid descending colon and in the ascending                            colon, removed with a cold snare. Resected and                            retrieved. Clip x 1. Reviewed.                           - The examination was otherwise normal on direct                            and retroflexion views. Moderate Sedation:      Moderate (conscious) sedation was personally administered by an       anesthesia professional. The following parameters were monitored: oxygen       saturation, heart rate, blood pressure, respiratory rate, EKG, adequacy       of pulmonary ventilation, and response to care. Recommendation:            Procedure Code(s):        --- Professional ---                           [phone], Colonoscopy, flexible; with removal of                            tumor(s), polyp(s), or other lesion(s) by snare  technique Diagnosis Code(s):        --- Professional ---                           Z86.010, Personal history of colonic polyps                           D12.4, Benign neoplasm of descending colon                           D12.2, Benign neoplasm of ascending colon                           K57.30, Diverticulosis of large intestine without                            perforation or abscess without bleeding CPT copyright 2022 [employer]. All rights reserved. The codes documented in this report are preliminary and upon coder review may  be revised to meet current compliance requirements. Rebecca Friends. Aryahna Spagna, MD Gennette Pac, MD 01/03/2023 12:11:15 PM This report has been signed electronically. Number of Addenda: 0

## 2023-01-03 NOTE — H&P (Signed)
@LOGO @   Primary Care Physician:  Bennie Pierini, FNP Primary Gastroenterologist:  Dr. Jena Gauss  Pre-Procedure History & Physical: HPI:  Rebecca Macdonald is a 77 y.o. female here for  surveillance colonoscopy.  Has a history of multiple colonic polyps removed in 2021.  Past Medical History:  Diagnosis Date   Asthma    Cataract    Hypertension     Past Surgical History:  Procedure Laterality Date   CHOLECYSTECTOMY  2013   COLONOSCOPY N/A 06/15/2016   Procedure: COLONOSCOPY;  Surgeon: Corbin Ade, MD;  Location: [facility];  Service: Endoscopy;  Laterality: N/A;  1215   COLONOSCOPY N/A 09/26/2019   Procedure: COLONOSCOPY;  Surgeon: Corbin Ade, MD;  Location: [facility];  Service: Endoscopy;  Laterality: N/A;  1:00-office said pt can't come any earlier d/t transportation   POLYPECTOMY  06/15/2016   Procedure: POLYPECTOMY;  Surgeon: Corbin Ade, MD;  Location: [facility];  Service: Endoscopy;;  colon   POLYPECTOMY  09/26/2019   Procedure: POLYPECTOMY;  Surgeon: Corbin Ade, MD;  Location: [facility];  Service: Endoscopy;;    Prior to Admission medications   Medication Sig Start Date End Date Taking? Authorizing Provider  alendronate (FOSAMAX) 70 MG tablet TAKE 1 TABLET EVERY SUNDAY ON EMPTY STOMACH WITH A FULL GLASS OF WATER 08/18/22  Yes Martin, Mary-Margaret, FNP  Budeson-Glycopyrrol-Formoterol (BREZTRI AEROSPHERE) 160-9-4.8 MCG/ACT AERO Inhale 1 puff into the lungs daily. 09/02/22  Yes Martin, Mary-Margaret, FNP  Calcium-Magnesium-Vitamin D (CALCIUM 1200+D3 PO) Take 1 tablet by mouth daily. With 1000 units D3   Yes [provider]  Cholecalciferol (VITAMIN D3) 50 MCG (2000 UT) capsule Take 2,000 Units by mouth daily.   Yes [provider]  famotidine (PEPCID) 20 MG tablet Take 1 tablet (20 mg total) by mouth 2 (two) times daily. 09/02/22  Yes Martin, Mary-Margaret, FNP  Menthol, Topical Analgesic, (PAIN RELIEVING PATCH ULTRA ST EX) Apply 1  Application topically daily as needed (pain in arm).   Yes [provider]  Na Sulfate-K Sulfate-Mg Sulf 17.5-3.13-1.6 GM/177ML SOLN As directed 11/30/22  Yes Stephnie Parlier, Gerrit Friends, MD  pravastatin (PRAVACHOL) 40 MG tablet Take 1 tablet (40 mg total) by mouth daily. 09/02/22  Yes Daphine Deutscher, Mary-Margaret, FNP    Allergies as of 11/30/2022   (No Known Allergies)    Family History  Problem Relation Age of Onset   Colon polyps Brother        high grade required partial colectomy but not cancer.    Colon cancer Neg Hx     Social History   Socioeconomic History   Marital status: Widowed    Spouse name: Not on file   Number of children: 1   Years of education: Not on file   Highest education level: 11th grade  Occupational History   Occupation: Retired  Tobacco Use   Smoking status: Former    Types: Cigarettes    Quit date: 03/13/2009    Years since quitting: 13.8   Smokeless tobacco: Never  Vaping Use   Vaping Use: Never used  Substance and Sexual Activity   Alcohol use: No   Drug use: No   Sexual activity: Yes  Other Topics Concern   Not on file  Social History Narrative   1 son, talks to or sees daily.   Social Determinants of Health   Financial Resource Strain: Low Risk  (09/01/2022)   Overall Financial Resource Strain (CARDIA)    Difficulty of Paying Living  Expenses: Not hard at all  Food Insecurity: No Food Insecurity (09/01/2022)   Hunger Vital Sign    Worried About Running Out of Food in the Last Year: Never true    Ran Out of Food in the Last Year: Never true  Transportation Needs: No Transportation Needs (09/01/2022)   PRAPARE - Administrator, Civil Service (Medical): No    Lack of Transportation (Non-Medical): No  Physical Activity: Sufficiently Active (09/01/2022)   Exercise Vital Sign    Days of Exercise per Week: 5 days    Minutes of Exercise per Session: 30 min  Stress: No Stress Concern Present (09/01/2022)   [employer] of Occupational  Health - Occupational Stress Questionnaire    Feeling of Stress : Not at all  Social Connections: Socially Isolated (09/01/2022)   Social Connection and Isolation Panel [NHANES]    Frequency of Communication with Friends and Family: More than three times a week    Frequency of Social Gatherings with Friends and Family: More than three times a week    Attends Religious Services: Never    Database administrator or Organizations: No    Attends Banker Meetings: Never    Marital Status: Widowed  Intimate Partner Violence: Not At Risk (09/01/2022)   Humiliation, Afraid, Rape, and Kick questionnaire    Fear of Current or Ex-Partner: No    Emotionally Abused: No    Physically Abused: No    Sexually Abused: No    Review of Systems: See HPI, otherwise negative ROS  Physical Exam: BP 125/73   Pulse 84   Temp 98.5 F (36.9 C) (Oral)   Resp (!) 21   Ht 4\' 9"  (1.448 m)   Wt 54.4 kg   SpO2 96%   BMI 25.97 kg/m  General:   Alert,  Well-developed, well-nourished, pleasant and cooperative in NAD Skin:  Intact without significant lesions or rashes. Eyes:  Sclera clear, no icterus.   Conjunctiva pink. Ears:  Normal auditory acuity. Nose:  No deformity, discharge,  or lesions. Mouth:  No deformity or lesions. Neck:  Supple; no masses or thyromegaly. No significant cervical adenopathy. Lungs:  Clear throughout to auscultation.   No wheezes, crackles, or rhonchi. No acute distress. Heart:  Regular rate and rhythm; no murmurs, clicks, rubs,  or gallops. Abdomen: Non-distended, normal bowel sounds.  Soft and nontender without appreciable mass or hepatosplenomegaly.  Pulses:  Normal pulses noted. Extremities:  Without clubbing or edema.  Impression/Plan:    77 year old lady history of colonic adenomas removed 3 years ago; here for surveillance colonoscopy.  No GI symptoms at this time.  I have offered her surveillance colonoscopy. The risks, benefits, limitations, alternatives and  imponderables have been reviewed with the patient. Questions have been answered. All parties are agreeable.       Notice: This dictation was prepared with Dragon dictation along with smaller phrase technology. Any transcriptional errors that result from this process are unintentional and may not be corrected upon review.

## 2023-01-03 NOTE — Anesthesia Postprocedure Evaluation (Signed)
Anesthesia Post Note  Patient: Shoua Colver Kuhrt  Procedure(s) Performed: COLONOSCOPY WITH PROPOFOL POLYPECTOMY HEMOSTASIS CLIP PLACEMENT  Patient location during evaluation: Phase II Anesthesia Type: General Level of consciousness: awake and alert and oriented Pain management: pain level controlled Vital Signs Assessment: post-procedure vital signs reviewed and stable Respiratory status: spontaneous breathing, nonlabored ventilation and respiratory function stable Cardiovascular status: blood pressure returned to baseline and stable Postop Assessment: no apparent nausea or vomiting Anesthetic complications: no  No notable events documented.   Last Vitals:  Vitals:   01/03/23 1212 01/03/23 1214  BP: (!) 84/50 (!) 112/57  Pulse:    Resp:    Temp:    SpO2:      Last Pain:  Vitals:   01/03/23 1208  TempSrc: Oral  PainSc: 0-No pain                 Hisayo Delossantos C Jeffrey Voth

## 2023-01-03 NOTE — Transfer of Care (Signed)
Immediate Anesthesia Transfer of Care Note  Patient: Rebecca Macdonald  Procedure(s) Performed: COLONOSCOPY WITH PROPOFOL POLYPECTOMY HEMOSTASIS CLIP PLACEMENT  Patient Location: Endoscopy Unit  Anesthesia Type:General  Level of Consciousness: awake, alert , and oriented  Airway & Oxygen Therapy: Patient Spontanous Breathing  Post-op Assessment: Report given to RN, Post -op Vital signs reviewed and stable, Patient moving all extremities X 4, and Patient able to stick tongue midline  Post vital signs: Reviewed  Last Vitals:  Vitals Value Taken Time  BP 94/46 01/03/23 1208  Temp 36.8 C 01/03/23 1208  Pulse 75 01/03/23 1208  Resp 16 01/03/23 1208  SpO2 96 % 01/03/23 1208    Last Pain:  Vitals:   01/03/23 1208  TempSrc: Oral  PainSc: 0-No pain      Patients Stated Pain Goal: 5 (01/03/23 1114)  Complications: No notable events documented.

## 2023-01-03 NOTE — Anesthesia Procedure Notes (Signed)
Procedure Name: General with mask airway Date/Time: 01/03/2023 11:48 AM  Performed by: Cy Blamer, CRNAPre-anesthesia Checklist: Patient identified, Emergency Drugs available, Suction available, Patient being monitored and Timeout performed Patient Re-evaluated:Patient Re-evaluated prior to induction Oxygen Delivery Method: Nasal cannula Placement Confirmation: positive ETCO2 Dental Injury: Teeth and Oropharynx as per pre-operative assessment

## 2023-01-03 NOTE — Anesthesia Preprocedure Evaluation (Addendum)
Anesthesia Evaluation  Patient identified by MRN, date of birth, ID band Patient awake    Reviewed: Allergy & Precautions, H&P , NPO status , Patient's Chart, lab work & pertinent test results  Airway Mallampati: II  TM Distance: >3 FB Neck ROM: Full    Dental  (+) Edentulous Upper, Edentulous Lower   Pulmonary asthma , former smoker   Pulmonary exam normal breath sounds clear to auscultation       Cardiovascular hypertension, Pt. on medications Normal cardiovascular exam Rhythm:Regular Rate:Normal     Neuro/Psych negative neurological ROS  negative psych ROS   GI/Hepatic Neg liver ROS,GERD  Medicated,,  Endo/Other  negative endocrine ROS    Renal/GU negative Renal ROS  negative genitourinary   Musculoskeletal negative musculoskeletal ROS (+)    Abdominal   Peds negative pediatric ROS (+)  Hematology negative hematology ROS (+)   Anesthesia Other Findings BPPV  Reproductive/Obstetrics negative OB ROS                             Anesthesia Physical Anesthesia Plan  ASA: 2  Anesthesia Plan: General   Post-op Pain Management: Minimal or no pain anticipated   Induction: Intravenous  PONV Risk Score and Plan: Propofol infusion  Airway Management Planned: Nasal Cannula and Natural Airway  Additional Equipment:   Intra-op Plan:   Post-operative Plan:   Informed Consent: I have reviewed the patients History and Physical, chart, labs and discussed the procedure including the risks, benefits and alternatives for the proposed anesthesia with the patient or authorized representative who has indicated his/her understanding and acceptance.     Dental advisory given  Plan Discussed with: CRNA and Surgeon  Anesthesia Plan Comments:        Anesthesia Quick Evaluation

## 2023-01-03 NOTE — Discharge Instructions (Signed)
  Colonoscopy Discharge Instructions  Read the instructions outlined below and refer to this sheet in the next few weeks. These discharge instructions provide you with general information on caring for yourself after you leave the hospital. Your doctor may also give you specific instructions. While your treatment has been planned according to the most current medical practices available, unavoidable complications occasionally occur. If you have any problems or questions after discharge, call Dr. Jena Gauss at [phone]. ACTIVITY You may resume your regular activity, but move at a slower pace for the next 24 hours.  Take frequent rest periods for the next 24 hours.  Walking will help get rid of the air and reduce the bloated feeling in your belly (abdomen).  No driving for 24 hours (because of the medicine (anesthesia) used during the test).   Do not sign any important legal documents or operate any machinery for 24 hours (because of the anesthesia used during the test).  NUTRITION Drink plenty of fluids.  You may resume your normal diet as instructed by your doctor.  Begin with a light meal and progress to your normal diet. Heavy or fried foods are harder to digest and may make you feel sick to your stomach (nauseated).  Avoid alcoholic beverages for 24 hours or as instructed.  MEDICATIONS You may resume your normal medications unless your doctor tells you otherwise.  WHAT YOU CAN EXPECT TODAY Some feelings of bloating in the abdomen.  Passage of more gas than usual.  Spotting of blood in your stool or on the toilet paper.  IF YOU HAD POLYPS REMOVED DURING THE COLONOSCOPY: No aspirin products for 7 days or as instructed.  No alcohol for 7 days or as instructed.  Eat a soft diet for the next 24 hours.  FINDING OUT THE RESULTS OF YOUR TEST Not all test results are available during your visit. If your test results are not back during the visit, make an appointment with your caregiver to find out the  results. Do not assume everything is normal if you have not heard from your caregiver or the medical facility. It is important for you to follow up on all of your test results.  SEEK IMMEDIATE MEDICAL ATTENTION IF: You have more than a spotting of blood in your stool.  Your belly is swollen (abdominal distention).  You are nauseated or vomiting.  You have a temperature over 101.  You have abdominal pain or discomfort that is severe or gets worse throughout the day.      3 polyps removed from your colon  Colon polyp and diverticulosis information provided  No future MRI until clip is gone   further recommendations to follow pending review of pathology report   at patient request, I called Petria Seamon at [phone]   reviewed findings and recommendations

## 2023-01-04 ENCOUNTER — Encounter: Payer: Self-pay | Admitting: Internal Medicine

## 2023-01-04 LAB — SURGICAL PATHOLOGY

## 2023-01-06 ENCOUNTER — Encounter (HOSPITAL_COMMUNITY): Payer: Self-pay | Admitting: Internal Medicine

## 2023-02-02 ENCOUNTER — Other Ambulatory Visit: Payer: Self-pay | Admitting: Nurse Practitioner

## 2023-02-02 DIAGNOSIS — M810 Age-related osteoporosis without current pathological fracture: Secondary | ICD-10-CM

## 2023-02-03 ENCOUNTER — Ambulatory Visit (INDEPENDENT_AMBULATORY_CARE_PROVIDER_SITE_OTHER): Payer: Medicare HMO | Admitting: Nurse Practitioner

## 2023-02-03 ENCOUNTER — Encounter: Payer: Self-pay | Admitting: Nurse Practitioner

## 2023-02-03 VITALS — BP 135/70 | HR 80 | Temp 97.2°F | Resp 20 | Ht <= 58 in | Wt 119.0 lb

## 2023-02-03 DIAGNOSIS — E559 Vitamin D deficiency, unspecified: Secondary | ICD-10-CM

## 2023-02-03 DIAGNOSIS — Z6825 Body mass index (BMI) 25.0-25.9, adult: Secondary | ICD-10-CM | POA: Diagnosis not present

## 2023-02-03 DIAGNOSIS — M810 Age-related osteoporosis without current pathological fracture: Secondary | ICD-10-CM

## 2023-02-03 DIAGNOSIS — H8110 Benign paroxysmal vertigo, unspecified ear: Secondary | ICD-10-CM | POA: Diagnosis not present

## 2023-02-03 DIAGNOSIS — E782 Mixed hyperlipidemia: Secondary | ICD-10-CM

## 2023-02-03 DIAGNOSIS — J410 Simple chronic bronchitis: Secondary | ICD-10-CM

## 2023-02-03 DIAGNOSIS — K219 Gastro-esophageal reflux disease without esophagitis: Secondary | ICD-10-CM | POA: Diagnosis not present

## 2023-02-03 MED ORDER — BREZTRI AEROSPHERE 160-9-4.8 MCG/ACT IN AERO
1.0000 | INHALATION_SPRAY | Freq: Every day | RESPIRATORY_TRACT | 11 refills | Status: DC
Start: 2023-02-03 — End: 2023-06-28

## 2023-02-03 MED ORDER — FAMOTIDINE 20 MG PO TABS
20.0000 mg | ORAL_TABLET | Freq: Two times a day (BID) | ORAL | 3 refills | Status: DC
Start: 2023-02-03 — End: 2024-03-23

## 2023-02-03 MED ORDER — PRAVASTATIN SODIUM 40 MG PO TABS
40.0000 mg | ORAL_TABLET | Freq: Every day | ORAL | 3 refills | Status: DC
Start: 2023-02-03 — End: 2024-03-23

## 2023-02-03 NOTE — Progress Notes (Signed)
Subjective:    Patient ID: Rebecca Macdonald, female    DOB: 1946-01-03, 78 y.o.   MRN: [id number]   Chief Complaint: medical management of chronic issues     HPI:  Rebecca Macdonald is a 77 y.o. who identifies as a female who was assigned female at birth.   Social history: Lives with: by herself- family checks on her daily Work history: retired   Water engineer in today for follow up of the following chronic medical issues:  1. Mixed hyperlipidemia Doe snot really watch diet and does little to no exercise. Lab Results  Component Value Date   CHOL 129 09/02/2022   HDL 44 09/02/2022   LDLCALC 66 09/02/2022   TRIG 105 09/02/2022   CHOLHDL 2.9 09/02/2022     2. Gastroesophageal reflux disease without esophagitis Takes pepcid as needed which works well for her  3. Benign paroxysmal positional vertigo, unspecified laterality No recent dizzy spells  4. Age-related osteoporosis without current pathological fracture Last dexascan was done in 2021. Needs to be repeated. Is on fosamax weekly  5. Vitamin D deficiency Is on a daily vitamin d supplement  6. Simple chronic bronchitis [name] works well. No wheezing  7. BMI 25.0-25.9,adult No recent weight changes Wt Readings from Last 3 Encounters:  02/03/23 119 lb (54 kg)  01/03/23 120 lb (54.4 kg)  09/02/22 120 lb (54.4 kg)   BMI Readings from Last 3 Encounters:  02/03/23 25.75 kg/m  01/03/23 25.97 kg/m  09/02/22 25.97 kg/m     New complaints: None today  No Known Allergies Outpatient Encounter Medications as of 02/03/2023  Medication Sig   alendronate (FOSAMAX) 70 MG tablet TAKE 1 TABLET EVERY SUNDAY ON EMPTY STOMACH WITH A FULL GLASS OF WATER   Budeson-Glycopyrrol-Formoterol (BREZTRI AEROSPHERE) 160-9-4.8 MCG/ACT AERO Inhale 1 puff into the lungs daily.   Calcium-Magnesium-Vitamin D (CALCIUM 1200+D3 PO) Take 1 tablet by mouth daily. With 1000 units D3   Cholecalciferol (VITAMIN D3) 50 MCG (2000 UT) capsule Take 2,000 Units  by mouth daily.   famotidine (PEPCID) 20 MG tablet Take 1 tablet (20 mg total) by mouth 2 (two) times daily.   Menthol, Topical Analgesic, (PAIN RELIEVING PATCH ULTRA ST EX) Apply 1 Application topically daily as needed (pain in arm).   Na Sulfate-K Sulfate-Mg Sulf 17.5-3.13-1.6 GM/177ML SOLN As directed   pravastatin (PRAVACHOL) 40 MG tablet Take 1 tablet (40 mg total) by mouth daily.   No facility-administered encounter medications on file as of 02/03/2023.    Past Surgical History:  Procedure Laterality Date   CHOLECYSTECTOMY  2013   COLONOSCOPY N/A 06/15/2016   Procedure: COLONOSCOPY;  Surgeon: Corbin Ade, MD;  [facility]: [facility];  Service: Endoscopy;  Laterality: N/A;  1215   COLONOSCOPY N/A 09/26/2019   Procedure: COLONOSCOPY;  Surgeon: Corbin Ade, MD;  [facility];  Service: Endoscopy;  Laterality: N/A;  1:00-office said pt can't come any earlier d/t transportation   COLONOSCOPY WITH PROPOFOL N/A 01/03/2023   Procedure: COLONOSCOPY WITH PROPOFOL;  Surgeon: Corbin Ade, MD;  [facility];  Service: Endoscopy;  Laterality: N/A;  12:30 pm, ASA 2   HEMOSTASIS CLIP PLACEMENT  01/03/2023   Procedure: HEMOSTASIS CLIP PLACEMENT;  Surgeon: Corbin Ade, MD;  [facility];  Service: Endoscopy;;   POLYPECTOMY  06/15/2016   Procedure: POLYPECTOMY;  Surgeon: Corbin Ade, MD;  [facility];  Service: Endoscopy;;  colon   POLYPECTOMY  09/26/2019  Procedure: POLYPECTOMY;  Surgeon: Corbin Ade, MD;  [facility];  Service: Endoscopy;;   POLYPECTOMY  01/03/2023   Procedure: POLYPECTOMY;  Surgeon: Corbin Ade, MD;  [facility];  Service: Endoscopy;;    Family History  Problem Relation Age of Onset   Colon polyps Brother        high grade required partial colectomy but not cancer.    Colon cancer Neg Hx       Controlled substance contract: n/a     Review of Systems  Constitutional:  Negative  for diaphoresis.  Eyes:  Negative for pain.  Respiratory:  Negative for shortness of breath.   Cardiovascular:  Negative for chest pain, palpitations and leg swelling.  Gastrointestinal:  Negative for abdominal pain.  Endocrine: Negative for polydipsia.  Skin:  Negative for rash.  Neurological:  Negative for dizziness, weakness and headaches.  Hematological:  Does not bruise/bleed easily.  All other systems reviewed and are negative.      Objective:   Physical Exam Vitals and nursing note reviewed.  Constitutional:      General: She is not in acute distress.    Appearance: Normal appearance. She is well-developed.  HENT:     Head: Normocephalic.     Right Ear: Tympanic membrane normal.     Left Ear: Tympanic membrane normal.     Nose: Nose normal.     Mouth/Throat:     Mouth: Mucous membranes are moist.  Eyes:     Pupils: Pupils are equal, round, and reactive to light.  Neck:     Vascular: No carotid bruit or JVD.  Cardiovascular:     Rate and Rhythm: Normal rate and regular rhythm.     Heart sounds: Normal heart sounds.  Pulmonary:     Effort: Pulmonary effort is normal. No respiratory distress.     Breath sounds: Normal breath sounds. No wheezing or rales.  Chest:     Chest wall: No tenderness.  Abdominal:     General: Bowel sounds are normal. There is no distension or abdominal bruit.     Palpations: Abdomen is soft. There is no hepatomegaly, splenomegaly, mass or pulsatile mass.     Tenderness: There is no abdominal tenderness.  Musculoskeletal:        General: Normal range of motion.     Cervical back: Normal range of motion and neck supple.  Lymphadenopathy:     Cervical: No cervical adenopathy.  Skin:    General: Skin is warm and dry.  Neurological:     Mental Status: She is alert and oriented to person, place, and time.     Deep Tendon Reflexes: Reflexes are normal and symmetric.  Psychiatric:        Behavior: Behavior normal.        Thought Content:  Thought content normal.        Judgment: Judgment normal.     BP 135/70   Pulse 80   Temp (!) 97.2 F (36.2 C) (Temporal)   Resp 20   Ht 4\' 9"  (1.448 m)   Wt 119 lb (54 kg)   SpO2 95%   BMI 25.75 kg/m        Assessment & Plan:  [name] in today with chief complaint of Medical Management of Chronic Issues and Pain in right hand   1. Mixed hyperlipidemia Low fat diet - pravastatin (PRAVACHOL) 40 MG tablet; Take 1 tablet (40 mg total) by mouth daily.  Dispense:  90 tablet; Refill: 3 - CBC with Differential/Platelet - CMP14+EGFR - Lipid panel  2. Gastroesophageal reflux disease without esophagitis Avoid spicy foods Do not eat 2 hours prior to bedtime - famotidine (PEPCID) 20 MG tablet; Take 1 tablet (20 mg total) by mouth 2 (two) times daily.  Dispense: 180 tablet; Refill: 3  3. Benign paroxysmal positional vertigo, unspecified laterality Report any vertigo  4. Age-related osteoporosis without current pathological fracture Weight bearing exercises - DG [facility] DEXA  5. Vitamin D deficiency Continue vitamin d supplement  6. BMI 25.0-25.9,adult Discussed diet and exercise for person with BMI >25 Will recheck weight in 3-6 months   7. Simple chronic bronchitis (HCC) - Budeson-Glycopyrrol-Formoterol (BREZTRI AEROSPHERE) 160-9-4.8 MCG/ACT AERO; Inhale 1 puff into the lungs daily.  Dispense: 10.7 g; Refill: 11    The above assessment and management plan was discussed with the patient. The patient verbalized understanding of and has agreed to the management plan. Patient is aware to call the clinic if symptoms persist or worsen. Patient is aware when to return to the clinic for a follow-up visit. Patient educated on when it is appropriate to go to the emergency department.   Mary-Margaret Daphine Deutscher, FNP

## 2023-02-03 NOTE — Patient Instructions (Signed)
Bone Health Bones protect organs, store calcium, anchor muscles, and support the whole body. Keeping your bones strong is important, especially as you get older. You can take actions to help keep your bones strong and healthy. Why is keeping my bones healthy important?  Keeping your bones healthy is important because your body constantly replaces bone cells. Cells get old, and new cells take their place. As we age, we lose bone cells because the body may not be able to make enough new cells to replace the old cells. The amount of bone cells and bone tissue you have is referred to as bone mass. The higher your bone mass, the stronger your bones. The aging process leads to an overall loss of bone mass in the body, which can increase the likelihood of: Broken bones. A condition in which the bones become weak and brittle (osteoporosis). A large decline in bone mass occurs in older adults. In women, it occurs about the time of menopause. What actions can I take to keep my bones healthy? Good health habits are important for maintaining healthy bones. This includes eating nutritious foods and exercising regularly. To have healthy bones, you need to get enough of the right minerals and vitamins. Most nutrition experts recommend getting these nutrients from the foods that you eat. In some cases, taking supplements may also be recommended. Doing certain types of exercise is also important for bone health. What are the nutritional recommendations for healthy bones?  Eating a well-balanced diet with plenty of calcium and vitamin D will help to protect your bones. Nutritional recommendations vary from person to person. Ask your health care provider what is healthy for you. Here are some general guidelines. Get enough calcium Calcium is the most important (essential) mineral for bone health. Most people can get enough calcium from their diet, but supplements may be recommended for people who are at risk for  osteoporosis. Good sources of calcium include: Dairy products, such as low-fat or nonfat milk, cheese, and yogurt. Dark green leafy vegetables, such as bok choy and broccoli. Foods that have calcium added to them (are fortified). Foods that may be fortified with calcium include orange juice, cereal, bread, soy beverages, and tofu products. Nuts, such as almonds. Follow these recommended amounts for daily calcium intake: Infants, 0-6 months: 200 mg. Infants, 6-12 months: 260 mg. Children, age 1-3: 700 mg. Children, age 4-8: 1,000 mg. Children, age 9-13: 1,300 mg. Teens, age 14-18: 1,300 mg. Adults, age 19-50: 1,000 mg. Adults, age 51-70: Men: 1,000 mg. Women: 1,200 mg. Adults, age 71 or older: 1,200 mg. Pregnant and breastfeeding females: Teens: 1,300 mg. Adults: 1,000 mg. Get enough vitamin D Vitamin D is the most essential vitamin for bone health. It helps the body absorb calcium. Sunlight stimulates the skin to make vitamin D, so be sure to get enough sunlight. If you live in a cold climate or you do not get outside often, your health care provider may recommend that you take vitamin D supplements. Good sources of vitamin D in your diet include: Egg yolks. Saltwater fish. Milk and cereal fortified with vitamin D. Follow these recommended amounts for daily vitamin D intake: Infants, 0-12 months: 400 international units (IU). Children and teens, age 1-18: 600 international units. Adults, age 59 or younger: 600 international units. Adults, age 60 or older: 600-1,000 international units. Get other important nutrients Other nutrients that are important for bone health include: Phosphorus. This mineral is found in meat, poultry, dairy foods, nuts, and legumes. The   recommended daily intake for adult men and adult women is 700 mg. Magnesium. This mineral is found in seeds, nuts, dark green vegetables, and legumes. The recommended daily intake for adult men is 400-420 mg. For adult women,  it is 310-320 mg. Vitamin K. This vitamin is found in green leafy vegetables. The recommended daily intake is 120 mcg for adult men and 90 mcg for adult women. What type of physical activity is best for building and maintaining healthy bones? Weight-bearing and strength-building activities are important for building and maintaining healthy bones. Weight-bearing activities cause muscles and bones to work against gravity. Strength-building activities increase the strength of the muscles that support bones. Weight-bearing and muscle-building activities include: Walking and hiking. Jogging and running. Dancing. Gym exercises. Lifting weights. Tennis and racquetball. Climbing stairs. Aerobics. Adults should get at least 30 minutes of moderate physical activity on most days. Children should get at least 60 minutes of moderate physical activity on most days. Ask your health care provider what type of exercise is best for you. How can I find out if my bone mass is low? Bone mass can be measured with an X-ray test called a bone mineral density (BMD) test. This test is recommended for all women who are age 65 or older. It may also be recommended for: Men who are age 70 or older. People who are at risk for osteoporosis because of: Having a long-term disease that weakens bones, such as kidney disease or rheumatoid arthritis. Having menopause earlier than normal. Taking medicine that weakens bones, such as steroids, thyroid hormones, or hormone treatment for breast cancer or prostate cancer. Smoking. Drinking three or more alcoholic drinks a day. Being underweight. Sedentary lifestyle. If you find that you have a low bone mass, you may be able to prevent osteoporosis or further bone loss by changing your diet and lifestyle. Where can I find more information? Bone Health & Osteoporosis Foundation: www.nof.org/patients National Institutes of Health: www.bones.nih.gov International Osteoporosis  Foundation: www.iofbonehealth.org Summary The aging process leads to an overall loss of bone mass in the body, which can increase the likelihood of broken bones and osteoporosis. Eating a well-balanced diet with plenty of calcium and vitamin D will help to protect your bones. Weight-bearing and strength-building activities are also important for building and maintaining strong bones. Bone mass can be measured with an X-ray test called a bone mineral density (BMD) test. This information is not intended to replace advice given to you by your health care provider. Make sure you discuss any questions you have with your health care provider. Document Revised: 01/14/2021 Document Reviewed: 01/14/2021 Elsevier Patient Education  2024 Elsevier Inc.  

## 2023-02-04 ENCOUNTER — Ambulatory Visit (INDEPENDENT_AMBULATORY_CARE_PROVIDER_SITE_OTHER): Payer: Medicare HMO

## 2023-02-04 ENCOUNTER — Other Ambulatory Visit: Payer: Medicare HMO

## 2023-02-04 DIAGNOSIS — M810 Age-related osteoporosis without current pathological fracture: Secondary | ICD-10-CM

## 2023-02-04 LAB — LIPID PANEL
Chol/HDL Ratio: 3.8 ratio (ref 0.0–4.4)
Cholesterol, Total: 136 mg/dL (ref 100–199)
HDL: 36 mg/dL — ABNORMAL LOW (ref >39)
LDL Chol Calc (NIH): 68 mg/dL (ref 0–99)
Triglycerides: 192 mg/dL — ABNORMAL HIGH (ref 0–149)
VLDL Cholesterol Cal: 32 mg/dL (ref 5–40)

## 2023-02-04 LAB — CBC WITH DIFFERENTIAL/PLATELET
Basophils Absolute: 0 10*3/uL (ref 0.0–0.2)
Basos: 0 %
EOS (ABSOLUTE): 0.1 10*3/uL (ref 0.0–0.4)
Eos: 1 %
Hematocrit: 43 % (ref 34.0–46.6)
Hemoglobin: 14.1 g/dL (ref 11.1–15.9)
Immature Grans (Abs): 0 10*3/uL (ref 0.0–0.1)
Immature Granulocytes: 0 %
Lymphocytes Absolute: 2.3 10*3/uL (ref 0.7–3.1)
Lymphs: 34 %
MCH: 29.5 pg (ref 26.6–33.0)
MCHC: 32.8 g/dL (ref 31.5–35.7)
MCV: 90 fL (ref 79–97)
Monocytes Absolute: 0.6 10*3/uL (ref 0.1–0.9)
Monocytes: 9 %
Neutrophils Absolute: 3.9 10*3/uL (ref 1.4–7.0)
Neutrophils: 56 %
Platelets: 169 10*3/uL (ref 150–450)
RBC: 4.78 x10E6/uL (ref 3.77–5.28)
RDW: 12.6 % (ref 11.7–15.4)
WBC: 6.9 10*3/uL (ref 3.4–10.8)

## 2023-02-04 LAB — CMP14+EGFR
ALT: 17 IU/L (ref 0–32)
AST: 23 IU/L (ref 0–40)
Albumin: 4.3 g/dL (ref 3.8–4.8)
Alkaline Phosphatase: 52 IU/L (ref 44–121)
BUN/Creatinine Ratio: 17 (ref 12–28)
BUN: 12 mg/dL (ref 8–27)
Bilirubin Total: 0.5 mg/dL (ref 0.0–1.2)
CO2: 27 mmol/L (ref 20–29)
Calcium: 9.3 mg/dL (ref 8.7–10.3)
Chloride: 105 mmol/L (ref 96–106)
Creatinine, Ser: 0.69 mg/dL (ref 0.57–1.00)
Globulin, Total: 2.5 g/dL (ref 1.5–4.5)
Glucose: 95 mg/dL (ref 70–99)
Potassium: 3.6 mmol/L (ref 3.5–5.2)
Sodium: 145 mmol/L — ABNORMAL HIGH (ref 134–144)
Total Protein: 6.8 g/dL (ref 6.0–8.5)
eGFR: 91 mL/min/{1.73_m2} (ref >59)

## 2023-05-06 ENCOUNTER — Other Ambulatory Visit: Payer: Self-pay | Admitting: Nurse Practitioner

## 2023-05-06 DIAGNOSIS — M810 Age-related osteoporosis without current pathological fracture: Secondary | ICD-10-CM

## 2023-06-28 ENCOUNTER — Telehealth: Payer: Self-pay | Admitting: Nurse Practitioner

## 2023-06-28 DIAGNOSIS — J410 Simple chronic bronchitis: Secondary | ICD-10-CM

## 2023-06-28 MED ORDER — BREZTRI AEROSPHERE 160-9-4.8 MCG/ACT IN AERO
2.0000 | INHALATION_SPRAY | Freq: Two times a day (BID) | RESPIRATORY_TRACT | 11 refills | Status: DC
Start: 2023-06-28 — End: 2023-08-04

## 2023-06-28 NOTE — Telephone Encounter (Signed)
Please advise 

## 2023-06-28 NOTE — Telephone Encounter (Signed)
[facility] pharmacy calling about Budeson-Glycopyrrol-Formoterol (BREZTRI AEROSPHERE) 160-9-4.8 MCG/ACT AERO directions. Need to be updated so that insurance will pay. Please call back

## 2023-06-28 NOTE — Addendum Note (Signed)
Addended by: Bennie Pierini on: 06/28/2023 03:43 PM   Modules accepted: Orders

## 2023-08-03 ENCOUNTER — Other Ambulatory Visit: Payer: Self-pay | Admitting: Nurse Practitioner

## 2023-08-03 DIAGNOSIS — M810 Age-related osteoporosis without current pathological fracture: Secondary | ICD-10-CM

## 2023-08-04 ENCOUNTER — Ambulatory Visit: Payer: Medicare HMO | Admitting: Nurse Practitioner

## 2023-08-04 ENCOUNTER — Encounter: Payer: Self-pay | Admitting: Nurse Practitioner

## 2023-08-04 VITALS — BP 148/71 | HR 92 | Temp 97.0°F | Ht <= 58 in | Wt 112.0 lb

## 2023-08-04 DIAGNOSIS — K219 Gastro-esophageal reflux disease without esophagitis: Secondary | ICD-10-CM | POA: Diagnosis not present

## 2023-08-04 DIAGNOSIS — E559 Vitamin D deficiency, unspecified: Secondary | ICD-10-CM

## 2023-08-04 DIAGNOSIS — J410 Simple chronic bronchitis: Secondary | ICD-10-CM

## 2023-08-04 DIAGNOSIS — M810 Age-related osteoporosis without current pathological fracture: Secondary | ICD-10-CM | POA: Diagnosis not present

## 2023-08-04 DIAGNOSIS — J301 Allergic rhinitis due to pollen: Secondary | ICD-10-CM

## 2023-08-04 DIAGNOSIS — E782 Mixed hyperlipidemia: Secondary | ICD-10-CM | POA: Diagnosis not present

## 2023-08-04 DIAGNOSIS — Z23 Encounter for immunization: Secondary | ICD-10-CM

## 2023-08-04 MED ORDER — BREZTRI AEROSPHERE 160-9-4.8 MCG/ACT IN AERO
2.0000 | INHALATION_SPRAY | Freq: Two times a day (BID) | RESPIRATORY_TRACT | 11 refills | Status: AC
Start: 2023-08-04 — End: ?

## 2023-08-04 MED ORDER — ALENDRONATE SODIUM 70 MG PO TABS
ORAL_TABLET | ORAL | 0 refills | Status: DC
Start: 2023-08-04 — End: 2024-01-17

## 2023-08-04 NOTE — Progress Notes (Signed)
Subjective:    Patient ID: Rebecca Macdonald, female    DOB: September 07, 1945, 77 y.o.   MRN: [id number]   Chief Complaint: medical management of chronic issues     HPI:  Rebecca Macdonald is a 77 y.o. who identifies as a female who was assigned female at birth.   Social history: Lives with: by herself- family checks on her daily Work history: retired   Water engineer in today for follow up of the following chronic medical issues:  1. Mixed hyperlipidemia Rebecca Macdonald and does little to no exercise. Lab Results  Component Value Date   CHOL 136 02/03/2023   HDL 36 (L) 02/03/2023   LDLCALC 68 02/03/2023   TRIG 192 (H) 02/03/2023   CHOLHDL 3.8 02/03/2023     2. Gastroesophageal reflux disease without esophagitis Takes pepcid as needed which works well for her  3. Benign paroxysmal positional vertigo, unspecified laterality No recent dizzy spells  4. Age-related osteoporosis without current pathological fracture Last dexascan was done in 2021. Needs to be repeated. Is on fosamax weekly  5. Vitamin D deficiency Is on a daily vitamin d supplement  6. Simple chronic bronchitis Rebecca Macdonald works well. No wheezing  7. BMI 25.0-25.9,adult No recent weight changes Wt Readings from Last 3 Encounters:  02/03/23 119 lb (54 kg)  01/03/23 120 lb (54.4 kg)  09/02/22 120 lb (54.4 kg)   BMI Readings from Last 3 Encounters:  02/03/23 25.75 kg/m  01/03/23 25.97 kg/m  09/02/22 25.97 kg/m     New complaints: None today  No Known Allergies Outpatient Encounter Medications as of 08/04/2023  Medication Sig   alendronate (FOSAMAX) 70 MG tablet TAKE 1 TABLET EVERY SUNDAY ON EMPTY STOMACH WITH A FULL GLASS OF WATER   Budeson-Glycopyrrol-Formoterol (BREZTRI AEROSPHERE) 160-9-4.8 MCG/ACT AERO Inhale 2 puffs into the lungs 2 (two) times daily.   Calcium-Magnesium-Vitamin D (CALCIUM 1200+D3 PO) Take 1 tablet by mouth daily. With 1000 units D3   Cholecalciferol (VITAMIN D3) 50 MCG (2000 UT)  capsule Take 2,000 Units by mouth daily.   famotidine (PEPCID) 20 MG tablet Take 1 tablet (20 mg total) by mouth 2 (two) times daily.   pravastatin (PRAVACHOL) 40 MG tablet Take 1 tablet (40 mg total) by mouth daily.   No facility-administered encounter medications on file as of 08/04/2023.    Past Surgical History:  Procedure Laterality Date   CHOLECYSTECTOMY  2013   COLONOSCOPY N/A 06/15/2016   Procedure: COLONOSCOPY;  Surgeon: Corbin Ade, MD;  Location: [facility];  Service: Endoscopy;  Laterality: N/A;  1215   COLONOSCOPY N/A 09/26/2019   Procedure: COLONOSCOPY;  Surgeon: Corbin Ade, MD;  Location: [facility];  Service: Endoscopy;  Laterality: N/A;  1:00-office said pt can't come any earlier d/t transportation   COLONOSCOPY WITH PROPOFOL N/A 01/03/2023   Procedure: COLONOSCOPY WITH PROPOFOL;  Surgeon: Corbin Ade, MD;  Location: [facility];  Service: Endoscopy;  Laterality: N/A;  12:30 pm, ASA 2   HEMOSTASIS CLIP PLACEMENT  01/03/2023   Procedure: HEMOSTASIS CLIP PLACEMENT;  Surgeon: Corbin Ade, MD;  Location: [facility];  Service: Endoscopy;;   POLYPECTOMY  06/15/2016   Procedure: POLYPECTOMY;  Surgeon: Corbin Ade, MD;  [facility]: [facility];  Service: Endoscopy;;  colon   POLYPECTOMY  09/26/2019   Procedure: POLYPECTOMY;  Surgeon: Corbin Ade, MD;  [facility];  Service: Endoscopy;;   POLYPECTOMY  01/03/2023   Procedure: POLYPECTOMY;  Surgeon:  Corbin Ade, MD;  [facility]: [facility];  Service: Endoscopy;;    Family History  Problem Relation Age of Onset   Colon polyps Brother        high grade required partial colectomy but not cancer.    Colon cancer Neg Hx       Controlled substance contract: n/a     Review of Systems  Constitutional:  Negative for diaphoresis.  Eyes:  Negative for pain.  Respiratory:  Negative for shortness of breath.   Cardiovascular:  Negative for chest pain, palpitations and leg swelling.   Gastrointestinal:  Negative for abdominal pain.  Endocrine: Negative for polydipsia.  Skin:  Negative for rash.  Neurological:  Negative for dizziness, weakness and headaches.  Hematological:  Does not bruise/bleed easily.  All other systems reviewed and are negative.      Objective:   Physical Exam Vitals and nursing note reviewed.  Constitutional:      General: She is not in acute distress.    Appearance: Normal appearance. She is well-developed.  HENT:     Head: Normocephalic.     Right Ear: Tympanic membrane normal.     Left Ear: Tympanic membrane normal.     Nose: Nose normal.     Mouth/Throat:     Mouth: Mucous membranes are moist.  Eyes:     Pupils: Pupils are equal, round, and reactive to light.  Neck:     Vascular: No carotid bruit or JVD.  Cardiovascular:     Rate and Rhythm: Normal rate and regular rhythm.     Heart sounds: Normal heart sounds.  Pulmonary:     Effort: Pulmonary effort is normal. No respiratory distress.     Breath sounds: Normal breath sounds. No wheezing or rales.  Chest:     Chest wall: No tenderness.  Abdominal:     General: Bowel sounds are normal. There is no distension or abdominal bruit.     Palpations: Abdomen is soft. There is no hepatomegaly, splenomegaly, mass or pulsatile mass.     Tenderness: There is no abdominal tenderness.  Musculoskeletal:        General: Normal range of motion.     Cervical back: Normal range of motion and neck supple.  Lymphadenopathy:     Cervical: No cervical adenopathy.  Skin:    General: Skin is warm and dry.  Neurological:     Mental Status: She is alert and oriented to person, place, and time.     Deep Tendon Reflexes: Reflexes are normal and symmetric.  Psychiatric:        Behavior: Behavior normal.        Thought Content: Thought content normal.        Judgment: Judgment normal.     BP (!) 148/71 (BP Location: Right Arm)   Pulse 92   Temp (!) 97 F (36.1 C)   Ht 4\' 9"  (1.448 m)   Wt  112 lb (50.8 kg)   SpO2 95%   BMI 24.24 kg/m         Assessment & Plan:  [name] in today with chief complaint of No chief complaint on file.   1. Mixed hyperlipidemia Low fat Macdonald - CBC with Differential/Platelet - CMP14+EGFR - Lipid panel  2. Gastroesophageal reflux disease without esophagitis Avoid spicy foods Do not eat 2 hours prior to bedtime  3. Benign paroxysmal positional vertigo, unspecified laterality Report any vertigo  4. Age-related osteoporosis without current pathological fracture Weight bearing  exercises - DG [facility] DEXA  5. Vitamin D deficiency Continue vitamin d supplement  6. BMI 25.0-25.9,adult Discussed Macdonald and exercise for person with BMI >25 Will recheck weight in 3-6 months   7. Simple chronic bronchitis (HCC) -continue bretztri as ordered   The above assessment and management plan was discussed with the patient. The patient verbalized understanding of and has agreed to the management plan. Patient is aware to call the clinic if symptoms persist or worsen. Patient is aware when to return to the clinic for a follow-up visit. Patient educated on when it is appropriate to go to the emergency department.   Mary-Margaret Daphine Deutscher, FNP

## 2023-08-04 NOTE — Patient Instructions (Signed)
Bone Health Bones protect organs, store calcium, anchor muscles, and support the whole body. Keeping your bones strong is important, especially as you get older. You can take actions to help keep your bones strong and healthy. Why is keeping my bones healthy important?  Keeping your bones healthy is important because your body constantly replaces bone cells. Cells get old, and new cells take their place. As we age, we lose bone cells because the body may not be able to make enough new cells to replace the old cells. The amount of bone cells and bone tissue you have is referred to as bone mass. The higher your bone mass, the stronger your bones. The aging process leads to an overall loss of bone mass in the body, which can increase the likelihood of: Broken bones. A condition in which the bones become weak and brittle (osteoporosis). A large decline in bone mass occurs in older adults. In women, it occurs about the time of menopause. What actions can I take to keep my bones healthy? Good health habits are important for maintaining healthy bones. This includes eating nutritious foods and exercising regularly. To have healthy bones, you need to get enough of the right minerals and vitamins. Most nutrition experts recommend getting these nutrients from the foods that you eat. In some cases, taking supplements may also be recommended. Doing certain types of exercise is also important for bone health. What are the nutritional recommendations for healthy bones?  Eating a well-balanced diet with plenty of calcium and vitamin D will help to protect your bones. Nutritional recommendations vary from person to person. Ask your health care provider what is healthy for you. Here are some general guidelines. Get enough calcium Calcium is the most important (essential) mineral for bone health. Most people can get enough calcium from their diet, but supplements may be recommended for people who are at risk for  osteoporosis. Good sources of calcium include: Dairy products, such as low-fat or nonfat milk, cheese, and yogurt. Dark green leafy vegetables, such as bok choy and broccoli. Foods that have calcium added to them (are fortified). Foods that may be fortified with calcium include orange juice, cereal, bread, soy beverages, and tofu products. Nuts, such as almonds. Follow these recommended amounts for daily calcium intake: Infants, 0-6 months: 200 mg. Infants, 6-12 months: 260 mg. Children, age 1-3: 700 mg. Children, age 4-8: 1,000 mg. Children, age 9-13: 1,300 mg. Teens, age 14-18: 1,300 mg. Adults, age 19-50: 1,000 mg. Adults, age 51-70: Men: 1,000 mg. Women: 1,200 mg. Adults, age 71 or older: 1,200 mg. Pregnant and breastfeeding females: Teens: 1,300 mg. Adults: 1,000 mg. Get enough vitamin D Vitamin D is the most essential vitamin for bone health. It helps the body absorb calcium. Sunlight stimulates the skin to make vitamin D, so be sure to get enough sunlight. If you live in a cold climate or you do not get outside often, your health care provider may recommend that you take vitamin D supplements. Good sources of vitamin D in your diet include: Egg yolks. Saltwater fish. Milk and cereal fortified with vitamin D. Follow these recommended amounts for daily vitamin D intake: Infants, 0-12 months: 400 international units (IU). Children and teens, age 1-18: 600 international units. Adults, age 59 or younger: 600 international units. Adults, age 60 or older: 600-1,000 international units. Get other important nutrients Other nutrients that are important for bone health include: Phosphorus. This mineral is found in meat, poultry, dairy foods, nuts, and legumes. The   recommended daily intake for adult men and adult women is 700 mg. Magnesium. This mineral is found in seeds, nuts, dark green vegetables, and legumes. The recommended daily intake for adult men is 400-420 mg. For adult women,  it is 310-320 mg. Vitamin K. This vitamin is found in green leafy vegetables. The recommended daily intake is 120 mcg for adult men and 90 mcg for adult women. What type of physical activity is best for building and maintaining healthy bones? Weight-bearing and strength-building activities are important for building and maintaining healthy bones. Weight-bearing activities cause muscles and bones to work against gravity. Strength-building activities increase the strength of the muscles that support bones. Weight-bearing and muscle-building activities include: Walking and hiking. Jogging and running. Dancing. Gym exercises. Lifting weights. Tennis and racquetball. Climbing stairs. Aerobics. Adults should get at least 30 minutes of moderate physical activity on most days. Children should get at least 60 minutes of moderate physical activity on most days. Ask your health care provider what type of exercise is best for you. How can I find out if my bone mass is low? Bone mass can be measured with an X-ray test called a bone mineral density (BMD) test. This test is recommended for all women who are age 65 or older. It may also be recommended for: Men who are age 70 or older. People who are at risk for osteoporosis because of: Having a long-term disease that weakens bones, such as kidney disease or rheumatoid arthritis. Having menopause earlier than normal. Taking medicine that weakens bones, such as steroids, thyroid hormones, or hormone treatment for breast cancer or prostate cancer. Smoking. Drinking three or more alcoholic drinks a day. Being underweight. Sedentary lifestyle. If you find that you have a low bone mass, you may be able to prevent osteoporosis or further bone loss by changing your diet and lifestyle. Where can I find more information? Bone Health & Osteoporosis Foundation: www.nof.org/patients National Institutes of Health: www.bones.nih.gov International Osteoporosis  Foundation: www.iofbonehealth.org Summary The aging process leads to an overall loss of bone mass in the body, which can increase the likelihood of broken bones and osteoporosis. Eating a well-balanced diet with plenty of calcium and vitamin D will help to protect your bones. Weight-bearing and strength-building activities are also important for building and maintaining strong bones. Bone mass can be measured with an X-ray test called a bone mineral density (BMD) test. This information is not intended to replace advice given to you by your health care provider. Make sure you discuss any questions you have with your health care provider. Document Revised: 01/14/2021 Document Reviewed: 01/14/2021 Elsevier Patient Education  2024 Elsevier Inc.  

## 2023-08-05 ENCOUNTER — Encounter: Payer: Self-pay | Admitting: Nurse Practitioner

## 2023-08-05 LAB — LIPID PANEL
Chol/HDL Ratio: 2.8 {ratio} (ref 0.0–4.4)
Cholesterol, Total: 131 mg/dL (ref 100–199)
HDL: 46 mg/dL (ref >39)
LDL Chol Calc (NIH): 64 mg/dL (ref 0–99)
Triglycerides: 116 mg/dL (ref 0–149)
VLDL Cholesterol Cal: 21 mg/dL (ref 5–40)

## 2023-08-05 LAB — CMP14+EGFR
ALT: 17 [IU]/L (ref 0–32)
AST: 25 [IU]/L (ref 0–40)
Albumin: 4.4 g/dL (ref 3.8–4.8)
Alkaline Phosphatase: 54 [IU]/L (ref 44–121)
BUN/Creatinine Ratio: 17 (ref 12–28)
BUN: 10 mg/dL (ref 8–27)
Bilirubin Total: 0.4 mg/dL (ref 0.0–1.2)
CO2: 25 mmol/L (ref 20–29)
Calcium: 9.5 mg/dL (ref 8.7–10.3)
Chloride: 103 mmol/L (ref 96–106)
Creatinine, Ser: 0.59 mg/dL (ref 0.57–1.00)
Globulin, Total: 2.4 g/dL (ref 1.5–4.5)
Glucose: 86 mg/dL (ref 70–99)
Potassium: 3.5 mmol/L (ref 3.5–5.2)
Sodium: 144 mmol/L (ref 134–144)
Total Protein: 6.8 g/dL (ref 6.0–8.5)
eGFR: 94 mL/min/{1.73_m2} (ref >59)

## 2023-08-05 LAB — CBC WITH DIFFERENTIAL/PLATELET
Basophils Absolute: 0 10*3/uL (ref 0.0–0.2)
Basos: 0 %
EOS (ABSOLUTE): 0 10*3/uL (ref 0.0–0.4)
Eos: 0 %
Hematocrit: 42.9 % (ref 34.0–46.6)
Hemoglobin: 13.9 g/dL (ref 11.1–15.9)
Immature Grans (Abs): 0 10*3/uL (ref 0.0–0.1)
Immature Granulocytes: 0 %
Lymphocytes Absolute: 2.1 10*3/uL (ref 0.7–3.1)
Lymphs: 29 %
MCH: 30.2 pg (ref 26.6–33.0)
MCHC: 32.4 g/dL (ref 31.5–35.7)
MCV: 93 fL (ref 79–97)
Monocytes Absolute: 0.6 10*3/uL (ref 0.1–0.9)
Monocytes: 8 %
Neutrophils Absolute: 4.6 10*3/uL (ref 1.4–7.0)
Neutrophils: 63 %
Platelets: 192 10*3/uL (ref 150–450)
RBC: 4.6 x10E6/uL (ref 3.77–5.28)
RDW: 12.4 % (ref 11.7–15.4)
WBC: 7.4 10*3/uL (ref 3.4–10.8)

## 2023-09-05 ENCOUNTER — Ambulatory Visit (INDEPENDENT_AMBULATORY_CARE_PROVIDER_SITE_OTHER): Payer: Medicare HMO

## 2023-09-05 VITALS — Ht <= 58 in | Wt 112.0 lb

## 2023-09-05 DIAGNOSIS — Z0000 Encounter for general adult medical examination without abnormal findings: Secondary | ICD-10-CM

## 2023-09-05 NOTE — Progress Notes (Signed)
Subjective:   Rebecca Macdonald is a 78 y.o. female who presents for Medicare Annual (Subsequent) preventive examination.  Visit Complete: Virtual I connected with  Rebecca Macdonald on 09/05/23 by a audio enabled telemedicine application and verified that I am speaking with the correct person using two identifiers.  Patient Location: Home  Provider Location: Home Office  This patient declined Interactive audio and video telecommunications. Therefore the visit was completed with audio only.  I discussed the limitations of evaluation and management by telemedicine. The patient expressed understanding and agreed to proceed.  Vital Signs: Because this visit was a virtual/telehealth visit, some criteria may be missing or patient reported. Any vitals not documented were not able to be obtained and vitals that have been documented are patient reported.  Cardiac Risk Factors include: advanced age (>22men, >59 women);dyslipidemia     Objective:    Today's Vitals   09/05/23 1315  Weight: 112 lb (50.8 kg)  Height: 4\' 9"  (1.448 m)   Body mass index is 24.24 kg/m.     09/05/2023    1:45 PM 01/03/2023   11:02 AM 09/01/2022    8:18 AM 08/31/2021    8:25 AM 02/05/2020    2:56 PM 09/26/2019   12:13 PM 01/26/2019    2:57 PM  Advanced Directives  Does Patient Have a Medical Advance Directive? No No No No No No No  Would patient like information on creating a medical advance directive? Yes (MAU/Ambulatory/Procedural Areas - Information given) No - Patient declined No - Patient declined No - Patient declined No - Patient declined No - Patient declined Yes (MAU/Ambulatory/Procedural Areas - Information given)    Current Medications (verified) Outpatient Encounter Medications as of 09/05/2023  Medication Sig   alendronate (FOSAMAX) 70 MG tablet TAKE 1 TABLET EVERY SUNDAY ON EMPTY STOMACH WITH A FULL GLASS OF WATER   Budeson-Glycopyrrol-Formoterol (BREZTRI AEROSPHERE) 160-9-4.8 MCG/ACT AERO Inhale 2 puffs  into the lungs 2 (two) times daily.   Calcium-Magnesium-Vitamin D (CALCIUM 1200+D3 PO) Take 1 tablet by mouth daily. With 1000 units D3   Cholecalciferol (VITAMIN D3) 50 MCG (2000 UT) capsule Take 2,000 Units by mouth daily.   famotidine (PEPCID) 20 MG tablet Take 1 tablet (20 mg total) by mouth 2 (two) times daily.   pravastatin (PRAVACHOL) 40 MG tablet Take 1 tablet (40 mg total) by mouth daily.   No facility-administered encounter medications on file as of 09/05/2023.    Allergies (verified) Patient has no known allergies.   History: Past Medical History:  Diagnosis Date   Asthma    Cataract    Hypertension    Past Surgical History:  Procedure Laterality Date   CHOLECYSTECTOMY  2013   COLONOSCOPY N/A 06/15/2016   Procedure: COLONOSCOPY;  Surgeon: Corbin Ade, MD;  [facility];  Service: Endoscopy;  Laterality: N/A;  1215   COLONOSCOPY N/A 09/26/2019   Procedure: COLONOSCOPY;  Surgeon: Corbin Ade, MD;  [facility];  Service: Endoscopy;  Laterality: N/A;  1:00-office said pt can't come any earlier d/t transportation   COLONOSCOPY WITH PROPOFOL N/A 01/03/2023   Procedure: COLONOSCOPY WITH PROPOFOL;  Surgeon: Corbin Ade, MD;  [facility]: [facility];  Service: Endoscopy;  Laterality: N/A;  12:30 pm, ASA 2   HEMOSTASIS CLIP PLACEMENT  01/03/2023   Procedure: HEMOSTASIS CLIP PLACEMENT;  Surgeon: Corbin Ade, MD;  [facility];  Service: Endoscopy;;   POLYPECTOMY  06/15/2016   Procedure: POLYPECTOMY;  Surgeon: Molly Maduro  Sonnie Alamo, MD;  [facility];  Service: Endoscopy;;  colon   POLYPECTOMY  09/26/2019   Procedure: POLYPECTOMY;  Surgeon: Corbin Ade, MD;  [facility];  Service: Endoscopy;;   POLYPECTOMY  01/03/2023   Procedure: POLYPECTOMY;  Surgeon: Corbin Ade, MD;  [facility];  Service: Endoscopy;;   Family History  Problem Relation Age of Onset   Colon polyps Brother        high grade  required partial colectomy but not cancer.    Colon cancer Neg Hx    Social History   Socioeconomic History   Marital status: Widowed    Spouse name: Not on file   Number of children: 1   Years of education: Not on file   Highest education level: 11th grade  Occupational History   Occupation: Retired  Tobacco Use   Smoking status: Former    Current packs/day: 0.00    Types: Cigarettes    Quit date: 03/13/2009    Years since quitting: 14.4   Smokeless tobacco: Never  Vaping Use   Vaping status: Never Used  Substance and Sexual Activity   Alcohol use: No   Drug use: No   Sexual activity: Yes  Other Topics Concern   Not on file  Social History Narrative   1 son, talks to or sees daily.   Social Drivers of Corporate investment banker Strain: Low Risk  (09/05/2023)   Overall Financial Resource Strain (CARDIA)    Difficulty of Paying Living Expenses: Not hard at all  Food Insecurity: No Food Insecurity (09/05/2023)   Hunger Vital Sign    Worried About Running Out of Food in the Last Year: Never true    Ran Out of Food in the Last Year: Never true  Transportation Needs: No Transportation Needs (09/05/2023)   PRAPARE - Administrator, Civil Service (Medical): No    Lack of Transportation (Non-Medical): No  Physical Activity: Insufficiently Active (09/05/2023)   Exercise Vital Sign    Days of Exercise per Week: 3 days    Minutes of Exercise per Session: 30 min  Stress: No Stress Concern Present (09/05/2023)   [employer] of Occupational Health - Occupational Stress Questionnaire    Feeling of Stress : Not at all  Social Connections: Socially Isolated (09/05/2023)   Social Connection and Isolation Panel [NHANES]    Frequency of Communication with Friends and Family: More than three times a week    Frequency of Social Gatherings with Friends and Family: Three times a week    Attends Religious Services: Never    Active Member of Clubs or Organizations: No     Attends Banker Meetings: Never    Marital Status: Widowed    Tobacco Counseling Counseling given: Not Answered   Clinical Intake:  Pre-visit preparation completed: Yes  Pain : No/denies pain     Diabetes: No  How often do you need to have someone help you when you read instructions, pamphlets, or other written materials from your doctor or pharmacy?: 1 - Never  Interpreter Needed?: No  Information entered by :: Kandis Fantasia LPN   Activities of Daily Living    09/05/2023    1:44 PM  In your present state of health, do you have any difficulty performing the following activities:  Hearing? 0  Vision? 0  Difficulty concentrating or making decisions? 0  Walking or climbing stairs? 0  Dressing or bathing? 0  Doing  errands, shopping? 0  Preparing Food and eating ? N  Using the Toilet? N  In the past six months, have you accidently leaked urine? N  Do you have problems with loss of bowel control? N  Managing your Medications? N  Managing your Finances? N  Housekeeping or managing your Housekeeping? N    Patient Care Team: Bennie Pierini, FNP as PCP - General (Family Medicine) Jena Gauss Gerrit Friends, MD as Consulting Physician (Gastroenterology)  Indicate any recent Medical Services you may have received from other than [facility] providers in the past year (date may be approximate).     Assessment:   This is a routine wellness examination for [place].  Hearing/Vision screen Hearing Screening - Comments:: Denies hearing difficulties   Vision Screening - Comments:: Wears rx glasses - up to date with routine eye exams with [facility]     Goals Addressed             This Visit's Progress    COMPLETED: Increase physical activity       Remain active and independent        Depression Screen    09/05/2023    1:43 PM 08/04/2023    2:24 PM 02/03/2023    2:56 PM 09/02/2022    2:36 PM 09/01/2022    8:17 AM 02/19/2022    2:29 PM 08/31/2021    8:23 AM   PHQ 2/9 Scores  PHQ - 2 Score 0 0 0 0 0 0 0  PHQ- 9 Score   0 0  0     Fall Risk    09/05/2023    1:44 PM 08/04/2023    2:24 PM 02/03/2023    2:56 PM 09/02/2022    2:36 PM 09/01/2022    8:15 AM  Fall Risk   Falls in the past year? 0 0 0 0 0  Number falls in past yr: 0    0  Injury with Fall? 0    0  Risk for fall due to : No Fall Risks    No Fall Risks  Follow up Falls prevention discussed;Education provided;Falls evaluation completed    Falls prevention discussed    MEDICARE RISK AT HOME: Medicare Risk at Home Any stairs in or around the home?: No If so, are there any without handrails?: No Home free of loose throw rugs in walkways, pet beds, electrical cords, etc?: Yes Adequate lighting in your home to reduce risk of falls?: Yes Life alert?: No Use of a cane, walker or w/c?: No Grab bars in the bathroom?: Yes Shower chair or bench in shower?: No Elevated toilet seat or a handicapped toilet?: Yes  TIMED UP AND GO:  Was the test performed?  No    Cognitive Function:        09/05/2023    1:45 PM 09/01/2022    8:19 AM 02/05/2020    2:53 PM 01/26/2019    3:01 PM  6CIT Screen  What Year? 0 points 0 points 0 points 0 points  What month? 0 points 0 points 0 points 0 points  What time? 0 points 0 points 0 points 0 points  Count back from 20 0 points 0 points 0 points 2 points  Months in reverse 0 points 0 points 4 points 2 points  Repeat phrase 0 points 0 points 2 points 0 points  Total Score 0 points 0 points 6 points 4 points    Immunizations Immunization History  Administered Date(s) Administered   Fluad Quad(high Dose  65+) 05/02/2019, 06/11/2020, 08/21/2021, 09/02/2022   Fluad Trivalent(High Dose 65+) 08/04/2023   Influenza, High Dose Seasonal PF 07/26/2018   Influenza,inj,Quad PF,6+ Mos 06/21/2013, 06/25/2016, 07/01/2017   Moderna SARS-COV2 Booster Vaccination 04/14/2021   Moderna Sars-Covid-2 Vaccination 10/16/2019, 11/13/2019, 07/08/2020    Pfizer(Comirnaty)Fall Seasonal Vaccine 12 years and older 06/07/2022   Pneumococcal Conjugate-13 06/21/2013   Pneumococcal Polysaccharide-23 04/16/2016   Tdap 02/07/2020   Zoster Recombinant(Shingrix) 02/12/2021, 09/23/2021    TDAP status: Up to date  Flu Vaccine status: Up to date  Pneumococcal vaccine status: Up to date  Covid-19 vaccine status: Information provided on how to obtain vaccines.   Qualifies for Shingles Vaccine? Yes   Zostavax completed No   Shingrix Completed?: Yes  Screening Tests Health Maintenance  Topic Date Due   COVID-19 Vaccine (6 - 2024-25 season) 04/17/2023   MAMMOGRAM  12/13/2023   Medicare Annual Wellness (AWV)  09/04/2024   DEXA SCAN  02/03/2025   Colonoscopy  01/02/2026   DTaP/Tdap/Td (2 - Td or Tdap) 02/06/2030   Pneumonia Vaccine 24+ Years old  Completed   INFLUENZA VACCINE  Completed   Hepatitis C Screening  Completed   Zoster Vaccines- Shingrix  Completed   HPV VACCINES  Aged Out    Health Maintenance  Health Maintenance Due  Topic Date Due   COVID-19 Vaccine (6 - 2024-25 season) 04/17/2023    Colorectal cancer screening: Type of screening: Colonoscopy. Completed 01/03/23. Repeat every 3 years  Mammogram status: Completed 12/13/22. Repeat every year  Bone Density status: Completed 02/04/23. Results reflect: Bone density results: OSTEOPOROSIS. Repeat every 2 years.  Lung Cancer Screening: (Low Dose CT Chest recommended if Age 32-80 years, 20 pack-year currently smoking OR have quit w/in 15years.) does not qualify.   Lung Cancer Screening Referral: n/a  Additional Screening:  Hepatitis C Screening: does qualify; Completed 04/16/16  Vision Screening: Recommended annual ophthalmology exams for early detection of glaucoma and other disorders of the eye. Is the patient up to date with their annual eye exam?  Yes  Who is the provider or what is the name of the office in which the patient attends annual eye exams? [facility] If pt  is not established with a provider, would they like to be referred to a provider to establish care? No .   Dental Screening: Recommended annual dental exams for proper oral hygiene  Community Resource Referral / Chronic Care Management: CRR required this visit?  No   CCM required this visit?  No     Plan:     I have personally reviewed and noted the following in the patient's chart:   Medical and social history Use of alcohol, tobacco or illicit drugs  Current medications and supplements including opioid prescriptions. Patient is not currently taking opioid prescriptions. Functional ability and status Nutritional status Physical activity Advanced directives List of other physicians Hospitalizations, surgeries, and ER visits in previous 12 months Vitals Screenings to include cognitive, depression, and falls Referrals and appointments  In addition, I have reviewed and discussed with patient certain preventive protocols, quality metrics, and best practice recommendations. A written personalized care plan for preventive services as well as general preventive health recommendations were provided to patient.     Kandis Fantasia [place], [place]   1/61/0960   After Visit Summary: (Mail) Due to this being a telephonic visit, the after visit summary with patients personalized plan was offered to patient via mail   Nurse Notes: No concerns at this time

## 2023-09-05 NOTE — Patient Instructions (Signed)
Rebecca Macdonald , Thank you for taking time to come for your Medicare Wellness Visit. I appreciate your ongoing commitment to your health goals. Please review the following plan we discussed and let me know if I can assist you in the future.   Referrals/Orders/Follow-Ups/Clinician Recommendations: Aim for 30 minutes of exercise or brisk walking, 6-8 glasses of water, and 5 servings of fruits and vegetables each day.  This is a list of the screening recommended for you and due dates:  Health Maintenance  Topic Date Due   COVID-19 Vaccine (6 - 2024-25 season) 04/17/2023   Mammogram  12/13/2023   Medicare Annual Wellness Visit  09/04/2024   DEXA scan (bone density measurement)  02/03/2025   Colon Cancer Screening  01/02/2026   DTaP/Tdap/Td vaccine (2 - Td or Tdap) 02/06/2030   Pneumonia Vaccine  Completed   Flu Shot  Completed   Hepatitis C Screening  Completed   Zoster (Shingles) Vaccine  Completed   HPV Vaccine  Aged Out    Advanced directives: (ACP Link)Information on Advanced Care Planning can be found at [facility] [place] Advance Health Care Directives Advance Health Care Directives ([email or web address]   Next Medicare Annual Wellness Visit scheduled for next year: Yes

## 2023-11-01 ENCOUNTER — Encounter: Payer: Self-pay | Admitting: Nurse Practitioner

## 2023-11-01 ENCOUNTER — Ambulatory Visit (INDEPENDENT_AMBULATORY_CARE_PROVIDER_SITE_OTHER): Admitting: Nurse Practitioner

## 2023-11-01 VITALS — BP 141/68 | HR 91 | Temp 97.8°F | Ht <= 58 in | Wt 112.0 lb

## 2023-11-01 DIAGNOSIS — R300 Dysuria: Secondary | ICD-10-CM | POA: Diagnosis not present

## 2023-11-01 LAB — URINALYSIS, COMPLETE
Bilirubin, UA: NEGATIVE
Glucose, UA: NEGATIVE
Ketones, UA: NEGATIVE
Leukocytes,UA: NEGATIVE
Nitrite, UA: NEGATIVE
Protein,UA: NEGATIVE
Specific Gravity, UA: <1.005 — ABNORMAL LOW (ref 1.005–1.030)
Urobilinogen, Ur: 0.2 mg/dL (ref 0.2–1.0)
pH, UA: 5.5 (ref 5.0–7.5)

## 2023-11-01 LAB — MICROSCOPIC EXAMINATION
Bacteria, UA: NONE SEEN
RBC, Urine: NONE SEEN /HPF (ref 0–2)
Renal Epithel, UA: NONE SEEN /HPF
WBC, UA: NONE SEEN /HPF (ref 0–5)
Yeast, UA: NONE SEEN

## 2023-11-01 NOTE — Progress Notes (Signed)
   Subjective:    Patient ID: Rebecca Macdonald, female    DOB: [birth year], 78 y.o.   MRN: [id number]   Chief Complaint: Urinary Tract Infection (Started Friday evening, hurting in back when urinating  and tingling while urinating. )   Urinary Tract Infection  This is a new problem. The current episode started in the past 7 days. The problem occurs intermittently. The problem has been waxing and waning. The quality of the pain is described as burning. The pain is at a severity of 7/10. The pain is mild. There has been no fever. She is Not sexually active. There is No history of pyelonephritis. Associated symptoms include frequency, hesitancy and urgency. Pertinent negatives include no chills. Treatments tried: cranberry juice, AZO and pain relievin patch OTC. The treatment provided mild relief. Her past medical history is significant for recurrent UTIs.    Patient Active Problem List   Diagnosis Date Noted   BMI 25.0-25.9,adult 08/07/2019   Age-related osteoporosis without current pathological fracture 01/16/2019   Vitamin D deficiency 01/16/2019   Seasonal allergic rhinitis due to pollen 01/16/2019   Gastroesophageal reflux disease without esophagitis 01/16/2019   Hyperlipidemia 06/25/2016   FHx: colonic polyps 05/26/2016   Benign paroxysmal positional vertigo 03/13/2013       Review of Systems  Constitutional:  Negative for chills.  Genitourinary:  Positive for frequency, hesitancy and urgency.       Objective:   Physical Exam Constitutional:      Appearance: Normal appearance.  Cardiovascular:     Rate and Rhythm: Normal rate and regular rhythm.     Heart sounds: Normal heart sounds.  Pulmonary:     Effort: Pulmonary effort is normal.     Breath sounds: Normal breath sounds.  Abdominal:     Tenderness: There is no right CVA tenderness or left CVA tenderness.  Skin:    General: Skin is warm.  Neurological:     General: No focal deficit present.     Mental Status: She is  alert and oriented to person, place, and time.  Psychiatric:        Mood and Affect: Mood normal.        Behavior: Behavior normal.    BP (!) 141/68   Pulse 91   Temp 97.8 F (36.6 C)   Ht 4\' 9"  (1.448 m)   Wt 112 lb (50.8 kg)   SpO2 91%   BMI 24.24 kg/m   Urine clear      Assessment & Plan:  Rebecca Macdonald in today with chief complaint of Urinary Tract Infection (Started Friday evening, hurting in back when urinating  and tingling while urinating. )   1. Dysuria (Primary) Continue cranberry juice Will wait on culture Force fluids RTO prn - Urinalysis, Complete - Urine Culture    The above assessment and management plan was discussed with the patient. The patient verbalized understanding of and has agreed to the management plan. Patient is aware to call the clinic if symptoms persist or worsen. Patient is aware when to return to the clinic for a follow-up visit. Patient educated on when it is appropriate to go to the emergency department.   Rebecca Daphine Deutscher, FNP

## 2023-11-01 NOTE — Patient Instructions (Signed)
 Dysuria Dysuria is pain or discomfort when you pee. The pain may be felt in your urethra, which is the part of your body that drains pee (urine) from your bladder. The pain may also be felt near your genitals, groin, or in your lower belly or back. You may have to pee often or have the sudden feeling that you need to pee. This condition can affect anyone, but it's more common in females. It can be caused by: A urinary tract infection (UTI). Kidney stones or bladder stones. Some sexually transmitted infections (STIs). Dehydration. This is when there's not enough water in your body. Irritation and swelling in the vagina. The use of some medicines. The use of some soaps or products with a scent. Follow these instructions at home: Medicines  Take your medicines only as told. Take your antibiotics as told. Do not stop taking them even if you start to feel better. Eating and drinking Drink enough fluid to keep your pee pale yellow. Certain drinks can make the pain worse. Avoid: Drinks with caffeine in them. Tea. Alcohol. In males, alcohol may irritate the prostate. General instructions Watch your condition for any changes, such as color changes in your pee. Pee often. Do not hold your pee for a long time. If you're female, wipe from front to back after you pee or poop. Use each tissue only once when you wipe. Pee after you have sex. If you've had any tests done, it's up to you to get your test results. Ask your health care provider, or the department doing the test, when your results will be ready. Contact a health care provider if: You have a fever or chills. You have pain in your back or sides. You throw up or feel like you may throw up. You have blood in your pee. You're not peeing as often as normal. You feel very weak. Get help right away if: You have very bad pain that doesn't get better with medicine. You're confused. You have a fast heartbeat while resting. This information is  not intended to replace advice given to you by your health care provider. Make sure you discuss any questions you have with your health care provider. Document Revised: 12/07/2022 Document Reviewed: 12/07/2022 Elsevier Patient Education  2024 [employer].

## 2023-11-02 LAB — URINE CULTURE: Organism ID, Bacteria: NO GROWTH

## 2023-11-29 ENCOUNTER — Other Ambulatory Visit: Payer: Self-pay | Admitting: Nurse Practitioner

## 2023-11-29 DIAGNOSIS — Z1231 Encounter for screening mammogram for malignant neoplasm of breast: Secondary | ICD-10-CM

## 2023-12-14 ENCOUNTER — Ambulatory Visit
Admission: RE | Admit: 2023-12-14 | Discharge: 2023-12-14 | Disposition: A | Discharge status: Home / Self Care | Source: Ambulatory Visit | Attending: Nurse Practitioner | Admitting: Nurse Practitioner

## 2023-12-14 DIAGNOSIS — Z1231 Encounter for screening mammogram for malignant neoplasm of breast: Secondary | ICD-10-CM | POA: Diagnosis not present

## 2024-01-17 ENCOUNTER — Other Ambulatory Visit: Payer: Self-pay | Admitting: Nurse Practitioner

## 2024-01-17 DIAGNOSIS — M810 Age-related osteoporosis without current pathological fracture: Secondary | ICD-10-CM

## 2024-02-02 ENCOUNTER — Ambulatory Visit: Payer: Medicare HMO | Admitting: Nurse Practitioner

## 2024-02-02 ENCOUNTER — Encounter: Payer: Self-pay | Admitting: Nurse Practitioner

## 2024-02-02 VITALS — BP 137/79 | HR 77 | Temp 97.2°F | Ht <= 58 in | Wt 115.5 lb

## 2024-02-02 DIAGNOSIS — M810 Age-related osteoporosis without current pathological fracture: Secondary | ICD-10-CM | POA: Diagnosis not present

## 2024-02-02 DIAGNOSIS — E559 Vitamin D deficiency, unspecified: Secondary | ICD-10-CM | POA: Diagnosis not present

## 2024-02-02 DIAGNOSIS — Z6825 Body mass index (BMI) 25.0-25.9, adult: Secondary | ICD-10-CM

## 2024-02-02 DIAGNOSIS — J410 Simple chronic bronchitis: Secondary | ICD-10-CM | POA: Insufficient documentation

## 2024-02-02 DIAGNOSIS — K219 Gastro-esophageal reflux disease without esophagitis: Secondary | ICD-10-CM

## 2024-02-02 DIAGNOSIS — H8110 Benign paroxysmal vertigo, unspecified ear: Secondary | ICD-10-CM

## 2024-02-02 DIAGNOSIS — E782 Mixed hyperlipidemia: Secondary | ICD-10-CM

## 2024-02-02 LAB — LIPID PANEL

## 2024-02-02 MED ORDER — ALENDRONATE SODIUM 70 MG PO TABS: ORAL_TABLET | ORAL | 3 refills | Status: DC

## 2024-02-02 NOTE — Progress Notes (Signed)
 Subjective:    Patient ID: Rebecca Macdonald, female    DOB: 1945-08-25, 78 y.o.   MRN: [id number]   Chief Complaint: medical management of chronic issues     HPI:  Rebecca Macdonald is a 78 y.o. who identifies as a female who was assigned female at birth.   Social history: Lives with: by herself- family checks on her daily Work history: retired   Water engineer in today for follow up of the following chronic medical issues:  1. Mixed hyperlipidemia Doe snot really watch diet and does little to no exercise. Lab Results  Component Value Date   CHOL 131 08/04/2023   HDL 46 08/04/2023   LDLCALC 64 08/04/2023   TRIG 116 08/04/2023   CHOLHDL 2.8 08/04/2023   The 10-year ASCVD risk score (Arnett DK, et al., 2019) is: 17.4%   2. Gastroesophageal reflux disease without esophagitis Takes pepcid  as needed which works well for her  3. Benign paroxysmal positional vertigo, unspecified laterality No recent dizzy spells  4. Age-related osteoporosis without current pathological fracture Last dexascan was done in 02/04/23. T score was -2.8. she continues  to be on fosamax . Does very little to no weight bearing exercise.  5. Vitamin D  deficiency Is on a daily vitamin d  supplement  6. Simple chronic bronchitis Breztri  works well. No wheezing  7. BMI 24.0-24.9,adult Weight is up 3 lbs Wt Readings from Last 3 Encounters:  02/02/24 115 lb 8 oz (52.4 kg)  11/01/23 112 lb (50.8 kg)  09/05/23 112 lb (50.8 kg)   BMI Readings from Last 3 Encounters:  02/02/24 24.99 kg/m  11/01/23 24.24 kg/m  09/05/23 24.24 kg/m      New complaints: None today  No Known Allergies Outpatient Encounter Medications as of 02/02/2024  Medication Sig   alendronate  (FOSAMAX ) 70 MG tablet TAKE ONE TABLET ON AN EMPTY STOMACH ONCE WEEKLY ON SUNDAYS   Budeson-Glycopyrrol-Formoterol  (BREZTRI  AEROSPHERE) 160-9-4.8 MCG/ACT AERO Inhale 2 puffs into the lungs 2 (two) times daily.   Calcium-Magnesium-Vitamin D  (CALCIUM  1200+D3 PO) Take 1 tablet by mouth daily. With 1000 units D3   Cholecalciferol (VITAMIN D3) 50 MCG (2000 UT) capsule Take 2,000 Units by mouth daily.   famotidine  (PEPCID ) 20 MG tablet Take 1 tablet (20 mg total) by mouth 2 (two) times daily.   pravastatin  (PRAVACHOL ) 40 MG tablet Take 1 tablet (40 mg total) by mouth daily.   No facility-administered encounter medications on file as of 02/02/2024.    Past Surgical History:  Procedure Laterality Date   CHOLECYSTECTOMY  2013   COLONOSCOPY N/A 06/15/2016   Procedure: COLONOSCOPY;  Surgeon: Suzette Espy, MD;  Location: [facility];  Service: Endoscopy;  Laterality: N/A;  1215   COLONOSCOPY N/A 09/26/2019   Procedure: COLONOSCOPY;  Surgeon: Suzette Espy, MD;  Location: [facility];  Service: Endoscopy;  Laterality: N/A;  1:00-office said pt can't come any earlier d/t transportation   COLONOSCOPY WITH PROPOFOL  N/A 01/03/2023   Procedure: COLONOSCOPY WITH PROPOFOL ;  Surgeon: Suzette Espy, MD;  Location: [facility];  Service: Endoscopy;  Laterality: N/A;  12:30 pm, ASA 2   HEMOSTASIS CLIP PLACEMENT  01/03/2023   Procedure: HEMOSTASIS CLIP PLACEMENT;  Surgeon: Suzette Espy, MD;  Location: [facility];  Service: Endoscopy;;   POLYPECTOMY  06/15/2016   Procedure: POLYPECTOMY;  Surgeon: Suzette Espy, MD;  [facility]: [facility];  Service: Endoscopy;;  colon   POLYPECTOMY  09/26/2019   Procedure: POLYPECTOMY;  Surgeon: Riley Cheadle,  Windsor Hatcher, MD;  Location: [facility];  Service: Endoscopy;;   POLYPECTOMY  01/03/2023   Procedure: POLYPECTOMY;  Surgeon: Suzette Espy, MD;  Location: [facility];  Service: Endoscopy;;    Family History  Problem Relation Age of Onset   Colon polyps Brother        high grade required partial colectomy but not cancer.    Colon cancer Neg Hx       Controlled substance contract: n/a     Review of Systems  Constitutional:  Negative for diaphoresis.  Eyes:  Negative for pain.  Respiratory:   Negative for shortness of breath.   Cardiovascular:  Negative for chest pain, palpitations and leg swelling.  Gastrointestinal:  Negative for abdominal pain.  Endocrine: Negative for polydipsia.  Skin:  Negative for rash.  Neurological:  Negative for dizziness, weakness and headaches.  Hematological:  Does not bruise/bleed easily.  All other systems reviewed and are negative.      Objective:   Physical Exam Vitals and nursing note reviewed.  Constitutional:      General: She is not in acute distress.    Appearance: Normal appearance. She is well-developed.  HENT:     Head: Normocephalic.     Right Ear: Tympanic membrane normal.     Left Ear: Tympanic membrane normal.     Nose: Nose normal.     Mouth/Throat:     Mouth: Mucous membranes are moist.   Eyes:     Pupils: Pupils are equal, round, and reactive to light.   Neck:     Vascular: No carotid bruit or JVD.   Cardiovascular:     Rate and Rhythm: Normal rate and regular rhythm.     Heart sounds: Normal heart sounds.  Pulmonary:     Effort: Pulmonary effort is normal. No respiratory distress.     Breath sounds: Normal breath sounds. No wheezing or rales.  Chest:     Chest wall: No tenderness.  Abdominal:     General: Bowel sounds are normal. There is no distension or abdominal bruit.     Palpations: Abdomen is soft. There is no hepatomegaly, splenomegaly, mass or pulsatile mass.     Tenderness: There is no abdominal tenderness.   Musculoskeletal:        General: Normal range of motion.     Cervical back: Normal range of motion and neck supple.  Lymphadenopathy:     Cervical: No cervical adenopathy.   Skin:    General: Skin is warm and dry.   Neurological:     Mental Status: She is alert and oriented to person, place, and time.     Deep Tendon Reflexes: Reflexes are normal and symmetric.   Psychiatric:        Behavior: Behavior normal.        Thought Content: Thought content normal.        Judgment: Judgment  normal.     BP 137/79   Pulse 77   Temp (!) 97.2 F (36.2 C)   Ht 4' 9 (1.448 m)   Wt 115 lb 8 oz (52.4 kg)   SpO2 93%   BMI 24.99 kg/m            Assessment & Plan:  [name] in today with chief complaint of medical management of chronic issues    1. Mixed hyperlipidemia Low fat diet - CBC with Differential/Platelet - CMP14+EGFR - Lipid panel  2. Gastroesophageal reflux disease without esophagitis Avoid spicy  foods Do not eat 2 hours prior to bedtime  3. Benign paroxysmal positional vertigo, unspecified laterality Report any vertigo  4. Age-related osteoporosis without current pathological fracture Weight bearing exercises encouraged Continue fosamax  weekly - DG [facility] DEXA  5. Vitamin D  deficiency Continue vitamin d  supplement  6. BMI 25.0-25.9,adult Discussed diet and exercise for person with BMI >25 Will recheck weight in 3-6 months   7. Simple chronic bronchitis (HCC) -continue breztri  as ordered   The above assessment and management plan was discussed with the patient. The patient verbalized understanding of and has agreed to the management plan. Patient is aware to call the clinic if symptoms persist or worsen. Patient is aware when to return to the clinic for a follow-up visit. Patient educated on when it is appropriate to go to the emergency department.   Mary-Margaret Gaylyn Keas, FNP

## 2024-02-02 NOTE — Patient Instructions (Signed)
 Fall Prevention in the Home, Adult Falls can cause injuries and can happen to people of all ages. There are many things you can do to make your home safer and to help prevent falls. What actions can I take to prevent falls? General information Use good lighting in all rooms. Make sure to: Replace any light bulbs that burn out. Turn on the lights in dark areas and use night-lights. Keep items that you use often in easy-to-reach places. Lower the shelves around your home if needed. Move furniture so that there are clear paths around it. Do not use throw rugs or other things on the floor that can make you trip. If any of your floors are uneven, fix them. Add color or contrast paint or tape to clearly mark and help you see: Grab bars or handrails. First and last steps of staircases. Where the edge of each step is. If you use a ladder or stepladder: Make sure that it is fully opened. Do not climb a closed ladder. Make sure the sides of the ladder are locked in place. Have someone hold the ladder while you use it. Know where your pets are as you move through your home. What can I do in the bathroom?     Keep the floor dry. Clean up any water on the floor right away. Remove soap buildup in the bathtub or shower. Buildup makes bathtubs and showers slippery. Use non-skid mats or decals on the floor of the bathtub or shower. Attach bath mats securely with double-sided, non-slip rug tape. If you need to sit down in the shower, use a non-slip stool. Install grab bars by the toilet and in the bathtub and shower. Do not use towel bars as grab bars. What can I do in the bedroom? Make sure that you have a light by your bed that is easy to reach. Do not use any sheets or blankets on your bed that hang to the floor. Have a firm chair or bench with side arms that you can use for support when you get dressed. What can I do in the kitchen? Clean up any spills right away. If you need to reach something  above you, use a step stool with a grab bar. Keep electrical cords out of the way. Do not use floor polish or wax that makes floors slippery. What can I do with my stairs? Do not leave anything on the stairs. Make sure that you have a light switch at the top and the bottom of the stairs. Make sure that there are handrails on both sides of the stairs. Fix handrails that are broken or loose. Install non-slip stair treads on all your stairs if they do not have carpet. Avoid having throw rugs at the top or bottom of the stairs. Choose a carpet that does not hide the edge of the steps on the stairs. Make sure that the carpet is firmly attached to the stairs. Fix carpet that is loose or worn. What can I do on the outside of my home? Use bright outdoor lighting. Fix the edges of walkways and driveways and fix any cracks. Clear paths of anything that can make you trip, such as tools or rocks. Add color or contrast paint or tape to clearly mark and help you see anything that might make you trip as you walk through a door, such as a raised step or threshold. Trim any bushes or trees on paths to your home. Check to see if handrails are loose  or broken and that both sides of all steps have handrails. Install guardrails along the edges of any raised decks and porches. Have leaves, snow, or ice cleared regularly. Use sand, salt, or ice melter on paths if you live where there is ice and snow during the winter. Clean up any spills in your garage right away. This includes grease or oil spills. What other actions can I take? Review your medicines with your doctor. Some medicines can cause dizziness or changes in blood pressure, which increase your risk of falling. Wear shoes that: Have a low heel. Do not wear high heels. Have rubber bottoms and are closed at the toe. Feel good on your feet and fit well. Use tools that help you move around if needed. These include: Canes. Walkers. Scooters. Crutches. Ask  your doctor what else you can do to help prevent falls. This may include seeing a physical therapist to learn to do exercises to move better and get stronger. Where to find more information Centers for Disease Control and Prevention, [employer]: TonerPromos.no [employer] on Aging: BaseRingTones.pl National Institute on Aging: BaseRingTones.pl Contact a doctor if: You are afraid of falling at home. You feel weak, drowsy, or dizzy at home. You fall at home. Get help right away if you: Lose consciousness or have trouble moving after a fall. Have a fall that causes a head injury. These symptoms may be an emergency. Get help right away. Call 911. Do not wait to see if the symptoms will go away. Do not drive yourself to the hospital. This information is not intended to replace advice given to you by your health care provider. Make sure you discuss any questions you have with your health care provider. Document Revised: 04/05/2022 Document Reviewed: 04/05/2022 Elsevier Patient Education  2024 [employer].

## 2024-02-03 ENCOUNTER — Ambulatory Visit: Payer: Self-pay | Admitting: Nurse Practitioner

## 2024-02-03 LAB — CMP14+EGFR
ALT: 13 IU/L (ref 0–32)
AST: 17 IU/L (ref 0–40)
Albumin: 4.2 g/dL (ref 3.8–4.8)
Alkaline Phosphatase: 52 IU/L (ref 44–121)
BUN/Creatinine Ratio: 13 (ref 12–28)
BUN: 8 mg/dL (ref 8–27)
Bilirubin Total: 0.4 mg/dL (ref 0.0–1.2)
CO2: 26 mmol/L (ref 20–29)
Calcium: 9.2 mg/dL (ref 8.7–10.3)
Chloride: 106 mmol/L (ref 96–106)
Creatinine, Ser: 0.61 mg/dL (ref 0.57–1.00)
Globulin, Total: 2.2 g/dL (ref 1.5–4.5)
Glucose: 100 mg/dL — ABNORMAL HIGH (ref 70–99)
Potassium: 3.4 mmol/L — ABNORMAL LOW (ref 3.5–5.2)
Sodium: 145 mmol/L — ABNORMAL HIGH (ref 134–144)
Total Protein: 6.4 g/dL (ref 6.0–8.5)
eGFR: 93 mL/min/{1.73_m2} (ref >59)

## 2024-02-03 LAB — CBC WITH DIFFERENTIAL/PLATELET
Basophils Absolute: 0 10*3/uL (ref 0.0–0.2)
Basos: 0 %
EOS (ABSOLUTE): 0 10*3/uL (ref 0.0–0.4)
Eos: 1 %
Hematocrit: 41.2 % (ref 34.0–46.6)
Hemoglobin: 13.6 g/dL (ref 11.1–15.9)
Immature Grans (Abs): 0 10*3/uL (ref 0.0–0.1)
Immature Granulocytes: 0 %
Lymphocytes Absolute: 2.3 10*3/uL (ref 0.7–3.1)
Lymphs: 29 %
MCH: 31.1 pg (ref 26.6–33.0)
MCHC: 33 g/dL (ref 31.5–35.7)
MCV: 94 fL (ref 79–97)
Monocytes Absolute: 0.6 10*3/uL (ref 0.1–0.9)
Monocytes: 8 %
Neutrophils Absolute: 4.8 10*3/uL (ref 1.4–7.0)
Neutrophils: 62 %
Platelets: 186 10*3/uL (ref 150–450)
RBC: 4.38 x10E6/uL (ref 3.77–5.28)
RDW: 12.7 % (ref 11.7–15.4)
WBC: 7.8 10*3/uL (ref 3.4–10.8)

## 2024-02-03 LAB — LIPID PANEL
Cholesterol, Total: 130 mg/dL (ref 100–199)
HDL: 44 mg/dL (ref >39)
LDL CALC COMMENT:: 3 ratio (ref 0.0–4.4)
LDL Chol Calc (NIH): 59 mg/dL (ref 0–99)
Triglycerides: 157 mg/dL — ABNORMAL HIGH (ref 0–149)
VLDL Cholesterol Cal: 27 mg/dL (ref 5–40)

## 2024-02-03 LAB — VITAMIN D 25 HYDROXY (VIT D DEFICIENCY, FRACTURES): Vit D, 25-Hydroxy: 48 ng/mL (ref 30.0–100.0)

## 2024-03-23 ENCOUNTER — Other Ambulatory Visit: Payer: Self-pay | Admitting: Nurse Practitioner

## 2024-03-23 DIAGNOSIS — K219 Gastro-esophageal reflux disease without esophagitis: Secondary | ICD-10-CM

## 2024-03-23 DIAGNOSIS — E782 Mixed hyperlipidemia: Secondary | ICD-10-CM

## 2024-06-25 ENCOUNTER — Ambulatory Visit: Admitting: Nurse Practitioner

## 2024-06-25 ENCOUNTER — Encounter: Payer: Self-pay | Admitting: Nurse Practitioner

## 2024-06-25 ENCOUNTER — Telehealth: Payer: Self-pay | Admitting: Nurse Practitioner

## 2024-06-25 VITALS — BP 165/86 | HR 93 | Temp 96.3°F | Ht <= 58 in | Wt 113.0 lb

## 2024-06-25 DIAGNOSIS — E782 Mixed hyperlipidemia: Secondary | ICD-10-CM | POA: Diagnosis not present

## 2024-06-25 DIAGNOSIS — Z23 Encounter for immunization: Secondary | ICD-10-CM

## 2024-06-25 DIAGNOSIS — F410 Panic disorder [episodic paroxysmal anxiety] without agoraphobia: Secondary | ICD-10-CM | POA: Diagnosis not present

## 2024-06-25 DIAGNOSIS — F419 Anxiety disorder, unspecified: Secondary | ICD-10-CM

## 2024-06-25 DIAGNOSIS — M810 Age-related osteoporosis without current pathological fracture: Secondary | ICD-10-CM

## 2024-06-25 MED ORDER — ALENDRONATE SODIUM 70 MG PO TABS
ORAL_TABLET | ORAL | 3 refills | Status: AC
Start: 2024-06-25 — End: ?

## 2024-06-25 MED ORDER — CITALOPRAM HYDROBROMIDE 20 MG PO TABS: 20.0000 mg | ORAL_TABLET | Freq: Every day | ORAL | 3 refills | Status: AC

## 2024-06-25 MED ORDER — PRAVASTATIN SODIUM 40 MG PO TABS
40.0000 mg | ORAL_TABLET | Freq: Every day | ORAL | 1 refills | Status: AC
Start: 2024-06-25 — End: ?

## 2024-06-25 NOTE — Progress Notes (Signed)
 Subjective:    Patient ID: Rebecca Macdonald, female    DOB: September 07, 1945, 78 y.o.   MRN: [id number]   Chief Complaint: anxiety  Anxiety Patient reports no chest pain, dizziness, palpitations or shortness of breath.      Patient in today c/o anxiety. Her son recently got arrested and is in jail. She is very anxious and shaky. Gets sob at times when she is really anxious.    06/25/2024   12:03 PM 02/02/2024    2:08 PM 08/04/2023    2:25 PM 02/03/2023    2:56 PM  GAD 7 : Generalized Anxiety Score  Nervous, Anxious, on Edge 2 0 0 0  Control/stop worrying 2 0 0 0  Worry too much - different things 2 0 0 0  Trouble relaxing 2 0 0 0  Restless 2 0 0 0  Easily annoyed or irritable 0 0 0 0  Afraid - awful might happen 1 0 0 0  Total GAD 7 Score 11 0 0 0  Anxiety Difficulty Not difficult at all Not difficult at all Not difficult at all Not difficult at all     Patient Active Problem List   Diagnosis Date Noted   Simple chronic bronchitis (HCC) 02/02/2024   BMI 25.0-25.9,adult 08/07/2019   Age-related osteoporosis without current pathological fracture 01/16/2019   Vitamin D  deficiency 01/16/2019   Seasonal allergic rhinitis due to pollen 01/16/2019   Gastroesophageal reflux disease without esophagitis 01/16/2019   Hyperlipidemia 06/25/2016   FHx: colonic polyps 05/26/2016   Benign paroxysmal positional vertigo 03/13/2013       Review of Systems  Constitutional:  Negative for diaphoresis.  Eyes:  Negative for pain.  Respiratory:  Negative for shortness of breath.   Cardiovascular:  Negative for chest pain, palpitations and leg swelling.  Gastrointestinal:  Negative for abdominal pain.  Endocrine: Negative for polydipsia.  Skin:  Negative for rash.  Neurological:  Negative for dizziness, weakness and headaches.  Hematological:  Does not bruise/bleed easily.  All other systems reviewed and are negative.      Objective:   Physical Exam Constitutional:      Appearance:  Normal appearance.  Cardiovascular:     Rate and Rhythm: Normal rate and regular rhythm.     Heart sounds: Normal heart sounds.  Pulmonary:     Breath sounds: Normal breath sounds.  Skin:    General: Skin is warm.  Neurological:     General: No focal deficit present.     Mental Status: She is alert and oriented to person, place, and time.  Psychiatric:        Mood and Affect: Mood normal.        Behavior: Behavior normal.    BP (!) 165/86   Pulse 93   Temp (!) 96.3 F (35.7 C) (Temporal)   Ht 4' 9 (1.448 m)   Wt 113 lb (51.3 kg)   SpO2 95%   BMI 24.45 kg/m         Assessment & Plan:   Rebecca Macdonald in today with chief complaint of Anxiety   1. Panic attacks (Primary) Stress management Deep breathing exercises - citalopram (CELEXA) 20 MG tablet; Take 1 tablet (20 mg total) by mouth daily.  Dispense: 30 tablet; Refill: 3  2. Anxiety - citalopram (CELEXA) 20 MG tablet; Take 1 tablet (20 mg total) by mouth daily.  Dispense: 30 tablet; Refill: 3   Letter written to get out of jury duty The above assessment and  management plan was discussed with the patient. The patient verbalized understanding of and has agreed to the management plan. Patient is aware to call the clinic if symptoms persist or worsen. Patient is aware when to return to the clinic for a follow-up visit. Patient educated on when it is appropriate to go to the emergency department.   Mary-Margaret Gladis, FNP

## 2024-06-25 NOTE — Addendum Note (Signed)
 Addended by: VIKTORIA ALAN MATSU on: 06/25/2024 12:29 PM   Modules accepted: Orders

## 2024-06-25 NOTE — Patient Instructions (Signed)

## 2024-06-25 NOTE — Telephone Encounter (Signed)
 Rebecca Macdonald  dropped off [employer] forms to be completed and signed.  Form Fee Paid? (Y/N)     No       If NO, form is placed on front office manager desk to hold until payment received. If YES, then form will be placed in the RX/HH Nurse Coordinators box for completion.  Form will not be processed until payment is received   Please call pt, when forms are ready!

## 2024-08-03 ENCOUNTER — Ambulatory Visit: Payer: Self-pay | Admitting: Nurse Practitioner

## 2024-08-03 VITALS — BP 128/74 | HR 73 | Temp 97.5°F | Ht <= 58 in | Wt 111.0 lb

## 2024-08-03 DIAGNOSIS — F419 Anxiety disorder, unspecified: Secondary | ICD-10-CM

## 2024-08-03 DIAGNOSIS — K219 Gastro-esophageal reflux disease without esophagitis: Secondary | ICD-10-CM

## 2024-08-03 DIAGNOSIS — E559 Vitamin D deficiency, unspecified: Secondary | ICD-10-CM

## 2024-08-03 DIAGNOSIS — E782 Mixed hyperlipidemia: Secondary | ICD-10-CM | POA: Diagnosis not present

## 2024-08-03 DIAGNOSIS — F410 Panic disorder [episodic paroxysmal anxiety] without agoraphobia: Secondary | ICD-10-CM

## 2024-08-03 DIAGNOSIS — M810 Age-related osteoporosis without current pathological fracture: Secondary | ICD-10-CM

## 2024-08-03 DIAGNOSIS — J410 Simple chronic bronchitis: Secondary | ICD-10-CM

## 2024-08-03 DIAGNOSIS — Z6825 Body mass index (BMI) 25.0-25.9, adult: Secondary | ICD-10-CM

## 2024-08-03 DIAGNOSIS — H8110 Benign paroxysmal vertigo, unspecified ear: Secondary | ICD-10-CM

## 2024-08-03 LAB — CMP14+EGFR
ALT: 16 IU/L (ref 0–32)
AST: 22 IU/L (ref 0–40)
Albumin: 4.1 g/dL (ref 3.8–4.8)
Alkaline Phosphatase: 50 IU/L (ref 49–135)
BUN/Creatinine Ratio: 18 (ref 12–28)
BUN: 12 mg/dL (ref 8–27)
Bilirubin Total: 0.4 mg/dL (ref 0.0–1.2)
CO2: 27 mmol/L (ref 20–29)
Calcium: 9.5 mg/dL (ref 8.7–10.3)
Chloride: 103 mmol/L (ref 96–106)
Creatinine, Ser: 0.65 mg/dL (ref 0.57–1.00)
Globulin, Total: 2.5 g/dL (ref 1.5–4.5)
Glucose: 84 mg/dL (ref 70–99)
Potassium: 3.8 mmol/L (ref 3.5–5.2)
Sodium: 144 mmol/L (ref 134–144)
Total Protein: 6.6 g/dL (ref 6.0–8.5)
eGFR: 91 mL/min/1.73

## 2024-08-03 LAB — CBC WITH DIFFERENTIAL/PLATELET
Basophils Absolute: 0 x10E3/uL (ref 0.0–0.2)
Basos: 0 %
EOS (ABSOLUTE): 0 x10E3/uL (ref 0.0–0.4)
Eos: 0 %
Hematocrit: 42 % (ref 34.0–46.6)
Hemoglobin: 13.8 g/dL (ref 11.1–15.9)
Immature Grans (Abs): 0 x10E3/uL (ref 0.0–0.1)
Immature Granulocytes: 0 %
Lymphocytes Absolute: 2.3 x10E3/uL (ref 0.7–3.1)
Lymphs: 26 %
MCH: 30.7 pg (ref 26.6–33.0)
MCHC: 32.9 g/dL (ref 31.5–35.7)
MCV: 93 fL (ref 79–97)
Monocytes Absolute: 0.7 x10E3/uL (ref 0.1–0.9)
Monocytes: 8 %
Neutrophils Absolute: 5.8 x10E3/uL (ref 1.4–7.0)
Neutrophils: 66 %
Platelets: 207 x10E3/uL (ref 150–450)
RBC: 4.5 x10E6/uL (ref 3.77–5.28)
RDW: 12.4 % (ref 11.7–15.4)
WBC: 8.8 x10E3/uL (ref 3.4–10.8)

## 2024-08-03 LAB — LIPID PANEL
Chol/HDL Ratio: 2.7 ratio (ref 0.0–4.4)
Cholesterol, Total: 125 mg/dL (ref 100–199)
HDL: 47 mg/dL
LDL Chol Calc (NIH): 57 mg/dL (ref 0–99)
Triglycerides: 119 mg/dL (ref 0–149)
VLDL Cholesterol Cal: 21 mg/dL (ref 5–40)

## 2024-08-03 MED ORDER — FAMOTIDINE 20 MG PO TABS: 20.0000 mg | ORAL_TABLET | Freq: Two times a day (BID) | ORAL | 1 refills | Status: AC

## 2024-08-03 MED ORDER — CITALOPRAM HYDROBROMIDE 20 MG PO TABS: 20.0000 mg | ORAL_TABLET | Freq: Every day | ORAL | 1 refills | Status: AC

## 2024-08-03 MED ORDER — PRAVASTATIN SODIUM 40 MG PO TABS: 40.0000 mg | ORAL_TABLET | Freq: Every day | ORAL | 1 refills | Status: AC

## 2024-08-03 MED ORDER — BREZTRI AEROSPHERE 160-9-4.8 MCG/ACT IN AERO: 2.0000 | INHALATION_SPRAY | Freq: Two times a day (BID) | RESPIRATORY_TRACT | 11 refills | Status: AC

## 2024-08-03 NOTE — Patient Instructions (Signed)
 Vertigo Vertigo is the feeling that you or the things around you are moving or spinning when they're not. It's different than feeling dizzy. It can also cause: Loss of balance. Trouble standing or walking. Nausea and vomiting. This feeling can come and go at any time. It can last from a few seconds to minutes or even hours. It may go away on its own or be treated with medicine. What are the types of vertigo? There are two types of vertigo: Peripheral vertigo happens when parts of your inner ear don't work like they should. This is the more common type. Central vertigo happens when your brain and spinal cord don't work like they should. Your health care provider will do tests to find out what kind of vertigo you have. This will help them decide on the right treatment for you. Follow these instructions at home: Eating and drinking Drink enough fluid to keep your pee (urine) pale yellow. Do not drink alcohol. Activity When you get up in the morning, first sit up on the side of the bed. When you feel okay, stand slowly while holding onto something. Move slowly. Avoid sudden body or head movements. Avoid certain positions, as told by your provider. Use a cane if you have trouble standing or walking. Sit down right away if you feel unsteady. Place items in your home so they're easy for you to reach without bending or leaning over. Return to normal activities when you're told. Ask what things are safe for you to do. General instructions Take your medicines only as told by your provider. Contact a health care provider if: Your medicines don't help or make your vertigo worse. You get new symptoms. You have a fever. You have nausea or vomiting. Your family or friends spot any changes in how you're acting. A part of your body goes numb. You feel tingling and prickling in a part of your body. You get very bad headaches. Get help right away if: You're always dizzy or you faint. You have a  stiff neck. You have trouble moving or speaking. Your hands, arms, or legs feel weak. Your hearing or eyesight changes. These symptoms may be an emergency. Call 911 right away. Do not wait to see if the symptoms will go away. Do not drive yourself to the hospital. This information is not intended to replace advice given to you by your health care provider. Make sure you discuss any questions you have with your health care provider. Document Revised: 05/05/2023 Document Reviewed: 11/05/2022 Elsevier Patient Education  2024 [employer].

## 2024-08-03 NOTE — Progress Notes (Signed)
 "  Subjective:    Patient ID: Rebecca Macdonald, female    DOB: 01-03-1946, 78 y.o.   MRN: [id number]   Chief Complaint: medical management of chronic issues     HPI:  Rebecca Macdonald is a 78 y.o. who identifies as a female who was assigned female at birth.   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 Mixed hyperlipidemia Doe snot really watch diet and does little to no exercise. Lab Results  Component Value Date   CHOL 130 02/02/2024   HDL 44 02/02/2024   LDLCALC 59 02/02/2024   TRIG 157 (H) 02/02/2024   CHOLHDL 3.0 02/02/2024   The 10-year ASCVD risk score (Arnett DK, et al., 2019) is: 26.7%   2. Gastroesophageal reflux disease without esophagitis Takes pepcid  as needed which works well for her  3. Benign paroxysmal positional vertigo, unspecified laterality No recent dizzy spells  4. Age-related osteoporosis without current pathological fracture Last dexascan was done in 02/04/23. T score was -2.8. she continues  to be on fosamax . Does very little to no weight bearing exercise.  5. Vitamin D  deficiency Is on a daily vitamin d  supplement  6. Simple chronic bronchitis Breztri  works well. No wheezing  7. BMI 24.0-24.9,adult Weight is down 2 lbs  Wt Readings from Last 3 Encounters:  08/03/24 111 lb (50.3 kg)  06/25/24 113 lb (51.3 kg)  02/02/24 115 lb 8 oz (52.4 kg)   BMI Readings from Last 3 Encounters:  08/03/24 24.02 kg/m  06/25/24 24.45 kg/m  02/02/24 24.99 kg/m           New complaints: None today  No Known Allergies Outpatient Encounter Medications as of 08/03/2024  Medication Sig   alendronate  (FOSAMAX ) 70 MG tablet TAKE ONE TABLET ON AN EMPTY STOMACH ONCE WEEKLY ON SUNDAYS   Budeson-Glycopyrrol-Formoterol  (BREZTRI  AEROSPHERE) 160-9-4.8 MCG/ACT AERO Inhale 2 puffs into the lungs 2 (two) times daily.    Calcium-Magnesium-Vitamin D  (CALCIUM 1200+D3 PO) Take 1 tablet by mouth daily. With 1000 units D3   Cholecalciferol (VITAMIN D3) 50 MCG (2000 UT) capsule Take 2,000 Units by mouth daily.   citalopram  (CELEXA ) 20 MG tablet Take 1 tablet (20 mg total) by mouth daily.   famotidine  (PEPCID ) 20 MG tablet TAKE ONE TABLET BY MOUTH TWICE DAILY   fenoprofen (NALFON) 600 MG TABS tablet Take 600 mg by mouth at bedtime.   pravastatin  (PRAVACHOL ) 40 MG tablet Take 1 tablet (40 mg total) by mouth daily.   No facility-administered encounter medications on file as of 08/03/2024.    Past Surgical History:  Procedure Laterality Date   CHOLECYSTECTOMY  2013   COLONOSCOPY N/A 06/15/2016   Procedure: COLONOSCOPY;  Surgeon: Lamar CHRISTELLA Hollingshead, MD;  [facility];  Service: Endoscopy;  Laterality: N/A;  1215   COLONOSCOPY N/A 09/26/2019   Procedure: COLONOSCOPY;  Surgeon: Hollingshead Lamar CHRISTELLA, MD;  [facility];  Service: Endoscopy;  Laterality: N/A;  1:00-office said pt can't come any earlier d/t transportation   COLONOSCOPY WITH PROPOFOL  N/A 01/03/2023   Procedure: COLONOSCOPY WITH PROPOFOL ;  Surgeon: Hollingshead Lamar CHRISTELLA, MD;  [facility];  Service: Endoscopy;  Laterality: N/A;  12:30 pm, ASA 2   HEMOSTASIS CLIP PLACEMENT  01/03/2023   Procedure: HEMOSTASIS CLIP PLACEMENT;  Surgeon: Hollingshead Lamar CHRISTELLA, MD;  [facility];  Service: Endoscopy;;   POLYPECTOMY  06/15/2016   Procedure: POLYPECTOMY;  Surgeon: Lamar CHRISTELLA Hollingshead, MD;  [facility];  Service: Endoscopy;;  colon   POLYPECTOMY  09/26/2019   Procedure: POLYPECTOMY;  Surgeon: Hollingshead Lamar CHRISTELLA, MD;  [facility];  Service: Endoscopy;;   POLYPECTOMY  01/03/2023   Procedure: POLYPECTOMY;  Surgeon: Hollingshead Lamar CHRISTELLA, MD;  [facility];  Service: Endoscopy;;    Family History  Problem Relation Age of Onset   Colon polyps Brother        high grade required partial colectomy but not cancer.    Colon cancer  Neg Hx       Controlled substance contract: n/a     Review of Systems  Constitutional:  Negative for diaphoresis.  Eyes:  Negative for pain.  Respiratory:  Negative for shortness of breath.   Cardiovascular:  Negative for chest pain, palpitations and leg swelling.  Gastrointestinal:  Negative for abdominal pain.  Endocrine: Negative for polydipsia.  Skin:  Negative for rash.  Neurological:  Negative for dizziness, weakness and headaches.  Hematological:  Does not bruise/bleed easily.  All other systems reviewed and are negative.      Objective:   Physical Exam Vitals and nursing note reviewed.  Constitutional:      General: She is not in acute distress.    Appearance: Normal appearance. She is well-developed.  HENT:     Head: Normocephalic.     Right Ear: Tympanic membrane normal.     Left Ear: Tympanic membrane normal.     Nose: Nose normal.     Mouth/Throat:     Mouth: Mucous membranes are moist.  Eyes:     Pupils: Pupils are equal, round, and reactive to light.  Neck:     Vascular: No carotid bruit or JVD.  Cardiovascular:     Rate and Rhythm: Normal rate and regular rhythm.     Heart sounds: Normal heart sounds.  Pulmonary:     Effort: Pulmonary effort is normal. No respiratory distress.     Breath sounds: Normal breath sounds. No wheezing or rales.  Chest:     Chest wall: No tenderness.  Abdominal:     General: Bowel sounds are normal. There is no distension or abdominal bruit.     Palpations: Abdomen is soft. There is no hepatomegaly, splenomegaly, mass or pulsatile mass.     Tenderness: There is no abdominal tenderness.  Musculoskeletal:        General: Normal range of motion.     Cervical back: Normal range of motion and neck supple.  Lymphadenopathy:     Cervical: No cervical adenopathy.  Skin:    General: Skin is warm and dry.  Neurological:     Mental Status: She is alert and oriented to person, place, and time.     Deep Tendon Reflexes:  Reflexes are normal and symmetric.  Psychiatric:        Behavior: Behavior normal.        Thought Content: Thought content normal.        Judgment: Judgment normal.     BP 128/74   Pulse 73   Temp (!) 97.5 F (36.4 C) (Temporal)   Ht 4' 9 (1.448 m)   Wt 111 lb (50.3 kg)   SpO2 94%   BMI 24.02 kg/m        Assessment & Plan:  Dickey DEL Brinley in today with chief complaint of medical management of chronic issues    1. Mixed hyperlipidemia  Low fat diet - CBC with Differential/Platelet - CMP14+EGFR - Lipid panel  2. Gastroesophageal reflux disease without esophagitis Avoid spicy foods Do not eat 2 hours prior to bedtime  3. Benign paroxysmal positional vertigo, unspecified laterality Report any vertigo  4. Age-related osteoporosis without current pathological fracture Weight bearing exercises encouraged Continue fosamax  weekly - DG [facility] DEXA  5. Vitamin D  deficiency Continue vitamin d  supplement  6. BMI 25.0-25.9,adult Discussed diet and exercise for person with BMI >25 Will recheck weight in 3-6 months   7. Simple chronic bronchitis (HCC) -continue breztri  as ordered   The above assessment and management plan was discussed with the patient. The patient verbalized understanding of and has agreed to the management plan. Patient is aware to call the clinic if symptoms persist or worsen. Patient is aware when to return to the clinic for a follow-up visit. Patient educated on when it is appropriate to go to the emergency department.   Mary-Margaret Gladis, FNP    "

## 2024-08-06 ENCOUNTER — Ambulatory Visit: Payer: Self-pay | Admitting: Nurse Practitioner

## 2024-09-05 ENCOUNTER — Ambulatory Visit: Payer: Medicare HMO

## 2024-09-05 VITALS — BP 128/74 | HR 73 | Ht <= 58 in | Wt 111.0 lb

## 2024-09-05 DIAGNOSIS — Z0000 Encounter for general adult medical examination without abnormal findings: Secondary | ICD-10-CM | POA: Diagnosis not present

## 2024-09-05 NOTE — Progress Notes (Signed)
 "  Chief Complaint  Patient presents with   Medicare Wellness     Subjective:   Rebecca Macdonald is a 79 y.o. female who presents for a Medicare Annual Wellness Visit.  Visit info / Clinical Intake: Medicare Wellness Visit Type:: Subsequent Annual Wellness Visit Persons participating in visit and providing information:: patient Medicare Wellness Visit Mode:: Telephone If telephone:: video declined Since this visit was completed virtually, some vitals may be partially provided or unavailable. Missing vitals are due to the limitations of the virtual format.: Unable to obtain vitals - no equipment If Telephone or Video please confirm:: I connected with patient using audio/video enable telemedicine. I verified patient identity with two identifiers, discussed telehealth limitations, and patient agreed to proceed. Patient Location:: home Provider Location:: office Interpreter Needed?: No Pre-visit prep was completed: yes AWV questionnaire completed by patient prior to visit?: no Living arrangements:: (!) lives alone Patient's Overall Health Status Rating: very good Typical amount of pain: none Does pain affect daily life?: no Are you currently prescribed opioids?: no  Dietary Habits and Nutritional Risks How many meals a day?: 2 Eats fruit and vegetables daily?: yes Most meals are obtained by: preparing own meals In the last 2 weeks, have you had any of the following?: none Diabetic:: no  Functional Status Activities of Daily Living (to include ambulation/medication): Independent Ambulation: Independent Medication Administration: Independent Home Management (perform basic housework or laundry): Independent Manage your own finances?: yes Primary transportation is: driving Concerns about vision?: no *vision screening is required for WTM* (last ov been 60yrs or so) Concerns about hearing?: no  Fall Screening Falls in the past year?: 0 Number of falls in past year: 0 Was there an  injury with Fall?: 0 Fall Risk Category Calculator: 0 Patient Fall Risk Level: Low Fall Risk  Fall Risk Patient at Risk for Falls Due to: No Fall Risks Fall risk Follow up: Falls evaluation completed; Education provided  Home and Transportation Safety: All rugs have non-skid backing?: yes All stairs or steps have railings?: N/A, no stairs Grab bars in the bathtub or shower?: (!) no Have non-skid surface in bathtub or shower?: yes Good home lighting?: yes Regular seat belt use?: yes Hospital stays in the last year:: no  Cognitive Assessment Difficulty concentrating, remembering, or making decisions? : no Will 6CIT or Mini Cog be Completed: yes What year is it?: 0 points What month is it?: 0 points Give patient an address phrase to remember (5 components): [address] [place] About what time is it?: 0 points Count backwards from 20 to 1: 0 points Say the months of the year in reverse: 0 points Repeat the address phrase from earlier: 0 points 6 CIT Score: 0 points  Advance Directives (For Healthcare) Does Patient Have a Medical Advance Directive?: No Would patient like information on creating a medical advance directive?: No - Patient declined  Reviewed/Updated  Reviewed/Updated: Reviewed All (Medical, Surgical, Family, Medications, Allergies, Care Teams, Patient Goals); Medical History; Surgical History; Family History; Medications; Allergies; Care Teams; Patient Goals    Allergies (verified) Patient has no known allergies.   Current Medications (verified) Outpatient Encounter Medications as of 09/05/2024  Medication Sig   alendronate  (FOSAMAX ) 70 MG tablet TAKE ONE TABLET ON AN EMPTY STOMACH ONCE WEEKLY ON SUNDAYS   budesonide -glycopyrrolate-formoterol  (BREZTRI  AEROSPHERE) 160-9-4.8 MCG/ACT AERO inhaler Inhale 2 puffs into the lungs 2 (two) times daily.   Calcium-Magnesium-Vitamin D  (CALCIUM 1200+D3 PO) Take 1 tablet by mouth daily. With 1000 units D3  Cholecalciferol (VITAMIN D3) 50 MCG (2000 UT) capsule Take 2,000 Units by mouth daily.   citalopram  (CELEXA ) 20 MG tablet Take 1 tablet (20 mg total) by mouth daily.   famotidine  (PEPCID ) 20 MG tablet Take 1 tablet (20 mg total) by mouth 2 (two) times daily.   fenoprofen (NALFON) 600 MG TABS tablet Take 600 mg by mouth at bedtime.   pravastatin  (PRAVACHOL ) 40 MG tablet Take 1 tablet (40 mg total) by mouth daily.   No facility-administered encounter medications on file as of 09/05/2024.    History: Past Medical History:  Diagnosis Date   Asthma    Cataract    Hypertension    Past Surgical History:  Procedure Laterality Date   CHOLECYSTECTOMY  2013   COLONOSCOPY N/A 06/15/2016   Procedure: COLONOSCOPY;  Surgeon: Lamar CHRISTELLA Hollingshead, MD;  [facility];  Service: Endoscopy;  Laterality: N/A;  1215   COLONOSCOPY N/A 09/26/2019   Procedure: COLONOSCOPY;  Surgeon: Hollingshead Lamar CHRISTELLA, MD;  [facility];  Service: Endoscopy;  Laterality: N/A;  1:00-office said pt can't come any earlier d/t transportation   COLONOSCOPY WITH PROPOFOL  N/A 01/03/2023   Procedure: COLONOSCOPY WITH PROPOFOL ;  Surgeon: Hollingshead Lamar CHRISTELLA, MD;  [facility];  Service: Endoscopy;  Laterality: N/A;  12:30 pm, ASA 2   HEMOSTASIS CLIP PLACEMENT  01/03/2023   Procedure: HEMOSTASIS CLIP PLACEMENT;  Surgeon: Hollingshead Lamar CHRISTELLA, MD;  [facility];  Service: Endoscopy;;   POLYPECTOMY  06/15/2016   Procedure: POLYPECTOMY;  Surgeon: Lamar CHRISTELLA Hollingshead, MD;  [facility];  Service: Endoscopy;;  colon   POLYPECTOMY  09/26/2019   Procedure: POLYPECTOMY;  Surgeon: Hollingshead Lamar CHRISTELLA, MD;  [facility];  Service: Endoscopy;;   POLYPECTOMY  01/03/2023   Procedure: POLYPECTOMY;  Surgeon: Hollingshead Lamar CHRISTELLA, MD;  [facility];  Service: Endoscopy;;   Family History  Problem Relation Age of Onset   Colon polyps Brother        high grade required partial colectomy but not cancer.     Colon cancer Neg Hx    Social History   Occupational History   Occupation: Retired  Tobacco Use   Smoking status: Former    Current packs/day: 0.00    Types: Cigarettes    Quit date: 03/13/2009    Years since quitting: 15.4   Smokeless tobacco: Never  Vaping Use   Vaping status: Never Used  Substance and Sexual Activity   Alcohol use: No   Drug use: No   Sexual activity: Yes   Tobacco Counseling Counseling given: Yes  SDOH Screenings   Food Insecurity: No Food Insecurity (09/05/2024)  Housing: Unknown (09/05/2024)  Transportation Needs: No Transportation Needs (09/05/2024)  Utilities: Not At Risk (09/05/2024)  Alcohol Screen: Low Risk (09/05/2023)  Depression (PHQ2-9): Low Risk (09/05/2024)  Recent Concern: Depression (PHQ2-9) - Medium Risk (06/25/2024)  Financial Resource Strain: Low Risk (09/05/2023)  Physical Activity: Insufficiently Active (09/05/2024)  Social Connections: Socially Isolated (09/05/2024)  Stress: No Stress Concern Present (09/05/2024)  Tobacco Use: Medium Risk (09/05/2024)  Health Literacy: Adequate Health Literacy (09/05/2024)   See flowsheets for full screening details  Depression Screen PHQ 2 & 9 Depression Scale- Over the past 2 weeks, how often have you been bothered by any of the following problems? Little interest or pleasure in doing things: 0 Feeling down, depressed, or hopeless (PHQ Adolescent also includes...irritable): 0 PHQ-2 Total Score: 0 Trouble falling or staying asleep, or sleeping too  much: 0 Feeling tired or having little energy: 2 Poor appetite or overeating (PHQ Adolescent also includes...weight loss): 1 Feeling bad about yourself - or that you are a failure or have let yourself or your family down: 0 Trouble concentrating on things, such as reading the newspaper or watching television (PHQ Adolescent also includes...like school work): 0 Moving or speaking so slowly that other people could have noticed. Or the opposite - being so fidgety  or restless that you have been moving around a lot more than usual: 0 Thoughts that you would be better off dead, or of hurting yourself in some way: 0 PHQ-9 Total Score: 5 If you checked off any problems, how difficult have these problems made it for you to do your work, take care of things at home, or get along with other people?: Somewhat difficult  Depression Treatment Depression Interventions/Treatment : Currently on Treatment     Goals Addressed             This Visit's Progress    Remain active and independent   On track            Objective:    There were no vitals filed for this visit. There is no height or weight on file to calculate BMI.  Hearing/Vision screen No results found. Immunizations and Health Maintenance Health Maintenance  Topic Date Due   COVID-19 Vaccine (6 - 2025-26 season) 04/16/2024   Mammogram  12/13/2024   Bone Density Scan  02/03/2025   Medicare Annual Wellness (AWV)  09/05/2025   Colonoscopy  01/02/2026   DTaP/Tdap/Td (2 - Td or Tdap) 02/06/2030   Pneumococcal Vaccine: 50+ Years  Completed   Influenza Vaccine  Completed   Hepatitis C Screening  Completed   Zoster Vaccines- Shingrix  Completed   Meningococcal B Vaccine  Aged Out        Assessment/Plan:  This is a routine wellness examination for Rebecca Macdonald.  Patient Care Team: Gladis Mustard, FNP as PCP - General (Family Medicine) Shaaron, Lamar HERO, MD as Consulting Physician (Gastroenterology)  I have personally reviewed and noted the following in the patients chart:   Medical and social history Use of alcohol, tobacco or illicit drugs  Current medications and supplements including opioid prescriptions. Functional ability and status Nutritional status Physical activity Advanced directives List of other physicians Hospitalizations, surgeries, and ER visits in previous 12 months Vitals Screenings to include cognitive, depression, and falls Referrals and  appointments  No orders of the defined types were placed in this encounter.  In addition, I have reviewed and discussed with patient certain preventive protocols, quality metrics, and best practice recommendations. A written personalized care plan for preventive services as well as general preventive health recommendations were provided to patient.   Ozie Ned, CMA   09/05/2024   Return in 1 year (on 09/05/2025).  After Visit Summary: (MyChart) Due to this being a telephonic visit, the after visit summary with patients personalized plan was offered to patient via MyChart   Nurse Notes: Per pt did not get the Covid vaccine, sug that if she decides to get it, she'll have to get it at the pharmacy.  "

## 2024-09-05 NOTE — Patient Instructions (Signed)
 Ms. Dewalt,  Thank you for taking the time for your Medicare Wellness Visit. I appreciate your continued commitment to your health goals. Please review the care plan we discussed, and feel free to reach out if I can assist you further.  Please note that Annual Wellness Visits do not include a physical exam. Some assessments may be limited, especially if the visit was conducted virtually. If needed, we may recommend an in-person follow-up with your provider.  Ongoing Care Seeing your primary care provider every 3 to 6 months helps us  monitor your health and provide consistent, personalized care.   Referrals If a referral was made during today's visit and you haven't received any updates within two weeks, please contact the referred provider directly to check on the status.  Recommended Screenings:  Health Maintenance  Topic Date Due   COVID-19 Vaccine (6 - 2025-26 season) 04/16/2024   Medicare Annual Wellness Visit  09/04/2024   Breast Cancer Screening  12/13/2024   Osteoporosis screening with Bone Density Scan  02/03/2025   Colon Cancer Screening  01/02/2026   DTaP/Tdap/Td vaccine (2 - Td or Tdap) 02/06/2030   Pneumococcal Vaccine for age over 44  Completed   Flu Shot  Completed   Hepatitis C Screening  Completed   Zoster (Shingles) Vaccine  Completed   Meningitis B Vaccine  Aged Out       09/05/2024   11:26 AM  Advanced Directives  Does Patient Have a Medical Advance Directive? No  Would patient like information on creating a medical advance directive? No - Patient declined    Vision: Annual vision screenings are recommended for early detection of glaucoma, cataracts, and diabetic retinopathy. These exams can also reveal signs of chronic conditions such as diabetes and high blood pressure.  Dental: Annual dental screenings help detect early signs of oral cancer, gum disease, and other conditions linked to overall health, including heart disease and diabetes.  Please see the  attached documents for additional preventive care recommendations.

## 2025-02-01 ENCOUNTER — Ambulatory Visit: Admitting: Nurse Practitioner

## 2025-09-06 ENCOUNTER — Ambulatory Visit
